# Patient Record
Sex: Male | Born: 1955 | Race: White | Hispanic: No | Marital: Single | Smoking: Never smoker
Health system: Southern US, Community
[De-identification: ages and names within clinical notes are randomized; demographics above are authoritative.]

## PROBLEM LIST (undated history)

## (undated) DIAGNOSIS — I4891 Unspecified atrial fibrillation: Secondary | ICD-10-CM

## (undated) DIAGNOSIS — F32A Depression, unspecified: Secondary | ICD-10-CM

## (undated) DIAGNOSIS — F309 Manic episode, unspecified: Secondary | ICD-10-CM

## (undated) DIAGNOSIS — F419 Anxiety disorder, unspecified: Secondary | ICD-10-CM

## (undated) DIAGNOSIS — I10 Essential (primary) hypertension: Secondary | ICD-10-CM

## (undated) DIAGNOSIS — F329 Major depressive disorder, single episode, unspecified: Secondary | ICD-10-CM

## (undated) HISTORY — PX: HERNIA REPAIR: SHX51

## (undated) HISTORY — DX: Anxiety disorder, unspecified: F41.9

## (undated) HISTORY — DX: Manic episode, unspecified: F30.9

## (undated) HISTORY — PX: TONSILLECTOMY: SUR1361

## (undated) HISTORY — DX: Depression, unspecified: F32.A

## (undated) HISTORY — DX: Major depressive disorder, single episode, unspecified: F32.9

---

## 2015-05-21 ENCOUNTER — Emergency Department: Payer: Managed Care, Other (non HMO)

## 2015-05-21 ENCOUNTER — Encounter: Payer: Self-pay | Admitting: Emergency Medicine

## 2015-05-21 ENCOUNTER — Inpatient Hospital Stay
Admission: EM | Admit: 2015-05-21 | Discharge: 2015-05-25 | DRG: 291 | Disposition: A | Discharge status: Home / Self Care | Payer: Managed Care, Other (non HMO) | Attending: Internal Medicine | Admitting: Internal Medicine

## 2015-05-21 DIAGNOSIS — J189 Pneumonia, unspecified organism: Secondary | ICD-10-CM

## 2015-05-21 DIAGNOSIS — Z79899 Other long term (current) drug therapy: Secondary | ICD-10-CM

## 2015-05-21 DIAGNOSIS — Z6833 Body mass index (BMI) 33.0-33.9, adult: Secondary | ICD-10-CM

## 2015-05-21 DIAGNOSIS — Z8249 Family history of ischemic heart disease and other diseases of the circulatory system: Secondary | ICD-10-CM

## 2015-05-21 DIAGNOSIS — E669 Obesity, unspecified: Secondary | ICD-10-CM | POA: Diagnosis present

## 2015-05-21 DIAGNOSIS — I110 Hypertensive heart disease with heart failure: Principal | ICD-10-CM | POA: Diagnosis present

## 2015-05-21 DIAGNOSIS — I482 Chronic atrial fibrillation: Secondary | ICD-10-CM | POA: Diagnosis present

## 2015-05-21 DIAGNOSIS — I509 Heart failure, unspecified: Secondary | ICD-10-CM | POA: Diagnosis not present

## 2015-05-21 DIAGNOSIS — E871 Hypo-osmolality and hyponatremia: Secondary | ICD-10-CM | POA: Diagnosis present

## 2015-05-21 DIAGNOSIS — I4891 Unspecified atrial fibrillation: Secondary | ICD-10-CM | POA: Diagnosis present

## 2015-05-21 DIAGNOSIS — J9601 Acute respiratory failure with hypoxia: Secondary | ICD-10-CM | POA: Diagnosis present

## 2015-05-21 DIAGNOSIS — Z7982 Long term (current) use of aspirin: Secondary | ICD-10-CM

## 2015-05-21 HISTORY — DX: Unspecified atrial fibrillation: I48.91

## 2015-05-21 HISTORY — DX: Essential (primary) hypertension: I10

## 2015-05-21 LAB — CBC WITH DIFFERENTIAL/PLATELET
BASOS ABS: 0 10*3/uL (ref 0–0.1)
Basophils Relative: 0 %
EOS ABS: 0 10*3/uL (ref 0–0.7)
EOS PCT: 0 %
HCT: 42.2 % (ref 40.0–52.0)
Hemoglobin: 14.3 g/dL (ref 13.0–18.0)
LYMPHS PCT: 10 %
Lymphs Abs: 0.9 10*3/uL — ABNORMAL LOW (ref 1.0–3.6)
MCH: 32.1 pg (ref 26.0–34.0)
MCHC: 33.9 g/dL (ref 32.0–36.0)
MCV: 94.8 fL (ref 80.0–100.0)
Monocytes Absolute: 1.3 10*3/uL — ABNORMAL HIGH (ref 0.2–1.0)
Monocytes Relative: 15 %
NEUTROS PCT: 75 %
Neutro Abs: 6.7 10*3/uL — ABNORMAL HIGH (ref 1.4–6.5)
PLATELETS: 155 10*3/uL (ref 150–440)
RBC: 4.45 MIL/uL (ref 4.40–5.90)
RDW: 13.9 % (ref 11.5–14.5)
WBC: 8.9 10*3/uL (ref 3.8–10.6)

## 2015-05-21 LAB — APTT: APTT: 33 s (ref 24–36)

## 2015-05-21 LAB — COMPREHENSIVE METABOLIC PANEL
ALBUMIN: 3.7 g/dL (ref 3.5–5.0)
ALK PHOS: 83 U/L (ref 38–126)
ALT: 31 U/L (ref 17–63)
ANION GAP: 9 (ref 5–15)
AST: 37 U/L (ref 15–41)
BILIRUBIN TOTAL: 1 mg/dL (ref 0.3–1.2)
BUN: 16 mg/dL (ref 6–20)
CALCIUM: 8.8 mg/dL — AB (ref 8.9–10.3)
CO2: 23 mmol/L (ref 22–32)
Chloride: 100 mmol/L — ABNORMAL LOW (ref 101–111)
Creatinine, Ser: 0.84 mg/dL (ref 0.61–1.24)
GFR calc Af Amer: >60 mL/min (ref >60)
GFR calc non Af Amer: >60 mL/min (ref >60)
GLUCOSE: 112 mg/dL — AB (ref 65–99)
Potassium: 4 mmol/L (ref 3.5–5.1)
SODIUM: 132 mmol/L — AB (ref 135–145)
TOTAL PROTEIN: 7.3 g/dL (ref 6.5–8.1)

## 2015-05-21 LAB — BRAIN NATRIURETIC PEPTIDE: B Natriuretic Peptide: 654 pg/mL — ABNORMAL HIGH (ref 0.0–100.0)

## 2015-05-21 LAB — PROTIME-INR
INR: 1.19
Prothrombin Time: 15.3 seconds — ABNORMAL HIGH (ref 11.4–15.0)

## 2015-05-21 LAB — BLOOD GAS, ARTERIAL
ALLENS TEST (PASS/FAIL): POSITIVE — AB
Acid-base deficit: 2 mmol/L (ref 0.0–2.0)
BICARBONATE: 22.3 meq/L (ref 21.0–28.0)
FIO2: 0.21
O2 Saturation: 92.7 %
PH ART: 7.4 (ref 7.350–7.450)
PO2 ART: 66 mmHg — AB (ref 83.0–108.0)
Patient temperature: 37
pCO2 arterial: 36 mmHg (ref 32.0–48.0)

## 2015-05-21 LAB — TROPONIN I
Troponin I: <0.03 ng/mL (ref <0.031)
Troponin I: 0.03 ng/mL (ref <0.031)

## 2015-05-21 LAB — GLUCOSE, CAPILLARY: Glucose-Capillary: 130 mg/dL — ABNORMAL HIGH (ref 65–99)

## 2015-05-21 MED ORDER — DILTIAZEM HCL 25 MG/5ML IV SOLN
20.0000 mg | Freq: Once | INTRAVENOUS | Status: AC
  Administered 2015-05-21: 10 mg via INTRAVENOUS
  Filled 2015-05-21: qty 5

## 2015-05-21 MED ORDER — ACETAMINOPHEN 325 MG PO TABS: 650.0000 mg | ORAL_TABLET | Freq: Four times a day (QID) | ORAL | Status: DC | PRN

## 2015-05-21 MED ORDER — ONDANSETRON HCL 4 MG/2ML IJ SOLN: 4.0000 mg | Freq: Four times a day (QID) | INTRAMUSCULAR | Status: DC | PRN

## 2015-05-21 MED ORDER — DILTIAZEM HCL ER COATED BEADS 240 MG PO CP24
240.0000 mg | ORAL_CAPSULE | Freq: Once | ORAL | Status: AC
  Administered 2015-05-21: 240 mg via ORAL
  Filled 2015-05-21: qty 1

## 2015-05-21 MED ORDER — FUROSEMIDE 10 MG/ML IJ SOLN
60.0000 mg | Freq: Once | INTRAMUSCULAR | Status: AC
  Administered 2015-05-21: 60 mg via INTRAVENOUS
  Filled 2015-05-21: qty 8

## 2015-05-21 MED ORDER — SODIUM CHLORIDE 0.9 % IV SOLN: 250.0000 mL | INTRAVENOUS | Status: DC | PRN

## 2015-05-21 MED ORDER — ZOLPIDEM TARTRATE 5 MG PO TABS: 5.0000 mg | ORAL_TABLET | Freq: Every evening | ORAL | Status: DC | PRN

## 2015-05-21 MED ORDER — CETYLPYRIDINIUM CHLORIDE 0.05 % MT LIQD: 7.0000 mL | Freq: Two times a day (BID) | OROMUCOSAL | Status: DC

## 2015-05-21 MED ORDER — DILTIAZEM HCL ER COATED BEADS 240 MG PO CP24: 240.0000 mg | ORAL_CAPSULE | Freq: Once | ORAL | Status: DC

## 2015-05-21 MED ORDER — SODIUM CHLORIDE 0.9% FLUSH
3.0000 mL | Freq: Two times a day (BID) | INTRAVENOUS | Status: DC
  Administered 2015-05-22 – 2015-05-24 (×5): 3 mL via INTRAVENOUS

## 2015-05-21 MED ORDER — SENNOSIDES-DOCUSATE SODIUM 8.6-50 MG PO TABS: 1.0000 | ORAL_TABLET | Freq: Every evening | ORAL | Status: DC | PRN

## 2015-05-21 MED ORDER — FUROSEMIDE 10 MG/ML IJ SOLN
20.0000 mg | Freq: Two times a day (BID) | INTRAMUSCULAR | Status: DC
  Administered 2015-05-21 – 2015-05-25 (×8): 20 mg via INTRAVENOUS
  Filled 2015-05-21 (×8): qty 2

## 2015-05-21 MED ORDER — SODIUM CHLORIDE 0.9% FLUSH
3.0000 mL | Freq: Two times a day (BID) | INTRAVENOUS | Status: DC
  Administered 2015-05-21 – 2015-05-25 (×5): 3 mL via INTRAVENOUS

## 2015-05-21 MED ORDER — DILTIAZEM HCL ER 240 MG PO CP24
240.0000 mg | ORAL_CAPSULE | Freq: Every day | ORAL | Status: DC
  Administered 2015-05-22: 240 mg via ORAL
  Filled 2015-05-21 (×2): qty 1

## 2015-05-21 MED ORDER — ASPIRIN EC 81 MG PO TBEC
81.0000 mg | DELAYED_RELEASE_TABLET | Freq: Every day | ORAL | Status: DC
  Administered 2015-05-22 – 2015-05-25 (×4): 81 mg via ORAL
  Filled 2015-05-21 (×5): qty 1

## 2015-05-21 MED ORDER — ENALAPRIL MALEATE 10 MG PO TABS
10.0000 mg | ORAL_TABLET | Freq: Every day | ORAL | Status: DC
Start: 2015-05-21 — End: 2015-05-25
  Administered 2015-05-22 – 2015-05-25 (×4): 10 mg via ORAL
  Filled 2015-05-21 (×2): qty 2
  Filled 2015-05-21 (×3): qty 1

## 2015-05-21 MED ORDER — ONDANSETRON HCL 4 MG PO TABS: 4.0000 mg | ORAL_TABLET | Freq: Four times a day (QID) | ORAL | Status: DC | PRN

## 2015-05-21 MED ORDER — DILTIAZEM HCL 100 MG IV SOLR
5.0000 mg/h | INTRAVENOUS | Status: DC
  Administered 2015-05-21: 5 mg/h via INTRAVENOUS
  Administered 2015-05-21: 15 mg/h via INTRAVENOUS
  Administered 2015-05-22: 5 mg/h via INTRAVENOUS
  Filled 2015-05-21 (×4): qty 100

## 2015-05-21 MED ORDER — SODIUM CHLORIDE 0.9% FLUSH: 3.0000 mL | INTRAVENOUS | Status: DC | PRN

## 2015-05-21 MED ORDER — ADULT MULTIVITAMIN W/MINERALS CH
1.0000 | ORAL_TABLET | Freq: Every day | ORAL | Status: DC
  Administered 2015-05-22 – 2015-05-25 (×4): 1 via ORAL
  Filled 2015-05-21 (×5): qty 1

## 2015-05-21 MED ORDER — ENOXAPARIN SODIUM 40 MG/0.4ML ~~LOC~~ SOLN
40.0000 mg | SUBCUTANEOUS | Status: DC
  Administered 2015-05-21 – 2015-05-24 (×4): 40 mg via SUBCUTANEOUS
  Filled 2015-05-21 (×4): qty 0.4

## 2015-05-21 MED ORDER — ACETAMINOPHEN 650 MG RE SUPP: 650.0000 mg | Freq: Four times a day (QID) | RECTAL | Status: DC | PRN

## 2015-05-21 MED ORDER — CHLORHEXIDINE GLUCONATE 0.12 % MT SOLN
15.0000 mL | Freq: Two times a day (BID) | OROMUCOSAL | Status: DC
  Administered 2015-05-22 – 2015-05-25 (×4): 15 mL via OROMUCOSAL
  Filled 2015-05-21 (×4): qty 15

## 2015-05-21 NOTE — H&P (Signed)
[facility] - [place] at [facility]   PATIENT NAME: Gianmarco Roye    MR#:  [id number]  DATE OF BIRTH:  Jul 07, 1955  DATE OF ADMISSION:  05/21/2015  PRIMARY CARE PHYSICIAN: [facility]   REQUESTING/REFERRING PHYSICIAN: McShane  CHIEF COMPLAINT:  Shortness of breath  HISTORY OF PRESENT ILLNESS:  Montay Vanvoorhis  is a 60 y.o. male with a known history of Essential hypertension and chronic atrial fibrillation, not on any anticoagulation other than aspirin is presenting to the ED with a chief complaint of shortness of breath. Patient has been short of breath for 1 day and reporting lower extremity edema for the past 2-3 days. He also has upper respiratory tract infection with nasal stuffiness and congestion for approximately 10 days and treated with antibiotics. Today patient is a very short of breath and went to the urgent care where his heart rate was found to be very fast and he was found in A. fib with RVR. Denies any chest pain, as patient's heart rate was at around 130s urgent care people have called EMS, patient was given Cardizem IV 2 boluses in the ED but patient's heart rate was still at 110-120. Patient was also given his home medication Cardizem CD by the ED physician. During my examination patient's heart rate was at around 120s to 130s. Cardizem drip was initiated for better control. Chest x-ray has revealed pulmonary edema  PAST MEDICAL HISTORY:   Past Medical History  Diagnosis Date  . Hypertension   . A-fib (HCC)     PAST SURGICAL HISTOIRY:   Past Surgical History  Procedure Laterality Date  . Hernia repair    . Tonsillectomy      SOCIAL HISTORY:   Social History  Substance Use Topics  . Smoking status: Never Smoker   . Smokeless tobacco: Not on file  . Alcohol Use: No    FAMILY HISTORY:  Essential hypertension  DRUG ALLERGIES:  No Known Allergies  REVIEW OF SYSTEMS:  CONSTITUTIONAL: No fever,Reporting generalized  weakness  EYES: No blurred or double vision.  EARS, NOSE, AND THROAT: No tinnitus or ear pain.  RESPIRATORY: Intermittent episodes of productive cough, shortness of breath, denies wheezing or hemoptysis.  CARDIOVASCULAR: No chest pain, orthopnea, reporting lower extremity edema.  GASTROINTESTINAL: No nausea, vomiting, diarrhea or abdominal pain.  GENITOURINARY: No dysuria, hematuria.  ENDOCRINE: No polyuria, nocturia,  HEMATOLOGY: No anemia, easy bruising or bleeding SKIN: No rash or lesion. MUSCULOSKELETAL: No joint pain or arthritis.   NEUROLOGIC: No tingling, numbness, weakness.  PSYCHIATRY: No anxiety or depression.   MEDICATIONS AT HOME:   Prior to Admission medications   Medication Sig Start Date End Date Taking? Authorizing Provider  aspirin EC 81 MG tablet Take 81 mg by mouth daily.   Yes Historical Provider, MD  diltiazem (DILACOR XR) 240 MG 24 hr capsule Take 240 mg by mouth daily.   Yes Historical Provider, MD  enalapril (VASOTEC) 10 MG tablet Take 10 mg by mouth daily.   Yes Historical Provider, MD  Multiple Vitamins-Minerals (MULTIVITAMIN WITH MINERALS) tablet Take 1 tablet by mouth daily.   Yes Historical Provider, MD      VITAL SIGNS:  Blood pressure 112/94, pulse 56, resp. rate 26, height  (1.854 m), weight 108.863 kg (240 lb), SpO2 97 %.  PHYSICAL EXAMINATION:  GENERAL:  59 y.o.-year-old patient lying in the bed with no acute distress.  EYES: Pupils equal, round, reactive to light and accommodation. No scleral icterus. Extraocular muscles  intact.  HEENT: Head atraumatic, normocephalic. Oropharynx and nasopharynx Congested NECK:  Supple, no jugular venous distention. No thyroid enlargement, no tenderness.  LUNGS: Moderate breath sounds bilaterally, very minimal wheezing, positive rales,rhonchi , no crepitation. No use of accessory muscles of respiration.  CARDIOVASCULAR: Irregularly irregular. No murmurs, rubs, or gallops.  ABDOMEN: Soft, nontender,  nondistended. Bowel sounds present. No organomegaly or mass.  EXTREMITIES: 1+ pitting pedal edema, no cyanosis, or clubbing.  NEUROLOGIC: Cranial nerves II through XII are intact. Muscle strength 5/5 in all extremities. Sensation intact. Gait not checked.  PSYCHIATRIC: The patient is alert and oriented x 3.  SKIN: No obvious rash, lesion, or ulcer.   LABORATORY PANEL:   CBC  Recent Labs Lab 05/21/15 0951  WBC 8.9  HGB 14.3  HCT 42.2  PLT 155   ------------------------------------------------------------------------------------------------------------------  Chemistries   Recent Labs Lab 05/21/15 0951  NA 132*  K 4.0  CL 100*  CO2 23  GLUCOSE 112*  BUN 16  CREATININE 0.84  CALCIUM 8.8*  AST 37  ALT 31  ALKPHOS 83  BILITOT 1.0   ------------------------------------------------------------------------------------------------------------------  Cardiac Enzymes  Recent Labs Lab 05/21/15 0951  TROPONINI <0.03   ------------------------------------------------------------------------------------------------------------------  RADIOLOGY:  Dg Chest 2 View  05/21/2015  CLINICAL DATA:  Shortness of breath. EXAM: CHEST  2 VIEW COMPARISON:  None. FINDINGS: Cardiomediastinal silhouette is within normal limits. No pneumothorax is noted. Bilateral perihilar and basilar interstitial densities are noted concerning for pulmonary edema or possibly pneumonia. Minimal bilateral pleural effusions are noted. Bony thorax is unremarkable. IMPRESSION: Bilateral perihilar and basilar interstitial densities concerning for pulmonary edema or less likely pneumonia. Minimal bilateral pleural effusions are noted. Electronically Signed   By: Lupita Raider, M.D.   On: 05/21/2015 10:37    EKG:   Orders placed or performed during the hospital encounter of 05/21/15  . EKG 12-Lead  . EKG 12-Lead    IMPRESSION AND PLAN:  Amiere Cawley  is a 60 y.o. male with a known history of Essential  hypertension and chronic atrial fibrillation, not on any anticoagulation other than aspirin is presenting to the ED with a chief complaint of shortness of breath. Patient has been short of breath for 1 day and reporting lower extremity edema for the past 2-3 days. He also has upper respiratory tract infection with nasal stuffiness and congestion for approximately 10 days and treated with antibiotics. Today patient is a very short of breath and went to the urgent care where his heart rate was found to be very fast and he was found in A. fib with RVR. Chest x-ray has revealed pulmonary edema. IV Lasix was given in the ED  #1 acute respiratory distress secondary to new onset congestive heart failure and A. fib with RVR Will provide oxygen via nasal cannula Lasix 60 mg IV was given in the ED Start the patient on BiPAP 2 Cardizem boluses were given with no improvement will start him on Cardizem drip  #2 acute congestive heart failure with pulmonary edema and lower extremity pitting edema Telemetry monitoring. Echo cardiac rhythm is ordered to evaluate ejection fraction and valvular dysfunction Continue Lasix IV. Daily weight monitoring and intake and output Continue baby aspirin. Cycle troponins. Cardiac consult is placed to Dr. Gwen Pounds BiPAP to help with diuresis  #3 atrial fibrillation with RVR Cardizem drip to titrate his heart rate 100. Resume his home medication Cardizem CD, today's dose was given in the ED Check TSH Patient is not on any anticoagulation other than baby  aspirin and [facility], will follow up with them regarding oral anticoagulation consideration, not sure whether he has any contraindications   #4 essential hypertension resume his home medications. Continue Vasotec and diltiazem  #5 obesity. Lifestyle medications are advised    All the records are reviewed and case discussed with ED provider. Management plans discussed with the patient, family and they are in  agreement.  CODE STATUS: Full code, 2 sons at the healthcare power of attorney  TOTAL CRITICAL CARE TAKING CARE OF THIS PATIENT: 45 minutes.    Ramonita LabGouru, Davie Sagona M.D on 05/21/2015 at 12:47 PM  Between 7am to 6pm - Pager - [phone]  After 6pm go to [email or web address] - password [email or web address] [facility]  [facility] [facility]  Office  [phone]  CC: Primary care physician; [facility]

## 2015-05-21 NOTE — Progress Notes (Signed)
Pt currently on Bipap. States "feels better". No complaints of pain. Report given to on coming RN.

## 2015-05-21 NOTE — ED Notes (Signed)
Pt O2 sats decreased to 93% with labored breathing; O2 [place] placed at 2L

## 2015-05-21 NOTE — ED Notes (Signed)
Ems reports that when they arrived, pt on 6L O2 and was satting in upper 90's. Pt given duoneb and not placed back on O2. Sat 94% upon assessment.

## 2015-05-21 NOTE — ED Notes (Signed)
Pt via ems from [facility] with increasing sob x 2 days. He was seen there for URI a week or so ago and returned today. Upon EMS arrival, he was in A fib with 150-170 HR. He was given duoneb + albuterol + 25mg  of cardizem en route by ems. Pt alert & oriented and states he has not taken his HTN or afib mnedication today

## 2015-05-21 NOTE — ED Provider Notes (Signed)
[facility] Emergency Department Provider Note  ____________________________________________   I have reviewed the triage vital signs and the nursing notes.   HISTORY  Chief Complaint Shortness of Breath    HPI Timothy Roach is a 60 y.o. male who presents today complaining of shortness of breath. Patient states he has a history of atrial fibrillation. He does not know the has a history of heart failure. Patient is a somewhat poor historian. He was treated for URI symptoms 2 weeks ago with antibiotics but then over the last 3-4 days he states he became acutely more short of breath. He has had some mild orthopnea. Patient states he did not take his diltiazem today nor did he take his hypertensive medications. He went to the minor care to get antibiotics for what he felt was another URI, given that he had some degree of cough but mostly shortness of breath. Upon their evaluation the patient was in A. fib with RVR and he was sent here for further evaluation. It was noted at the urgent care that the patient has bilateral lower extremity edema, which the patient was not aware of. The patient himself states that he has not noticed anything like that. He denies any fevers. He does state that he has a occasionally productive cough. He denies any other infectious symptoms. He states that he does have occasional orthopnea. He does not have any chest pain. EMS did note his A. fib with RVR and gave her appropriate medications and his heart rates in the 110 to 1:30 range at this time.      Past Medical History  Diagnosis Date  . Hypertension   . A-fib (HCC)     There are no active problems to display for this patient.   Past Surgical History  Procedure Laterality Date  . Hernia repair    . Tonsillectomy      No current outpatient prescriptions on file.  Allergies Review of patient's allergies indicates no known allergies.  History reviewed. No pertinent family  history.  Social History Social History  Substance Use Topics  . Smoking status: Never Smoker   . Smokeless tobacco: None  . Alcohol Use: No    Review of Systems Constitutional: No fever/chills Eyes: No visual changes. ENT: No sore throat. No stiff neck no neck pain Cardiovascular: Denies chest pain. Respiratory: Positive shortness of breath. Gastrointestinal:   no vomiting.  No diarrhea.  No constipation. Genitourinary: Negative for dysuria. Musculoskeletal: Negative lower extremity swelling Skin: Negative for rash. Neurological: Negative for headaches, focal weakness or numbness. 10-point ROS otherwise negative.  ____________________________________________   PHYSICAL EXAM:  VITAL SIGNS: ED Triage Vitals  Enc Vitals Group     BP --      Pulse --      Resp --      Temp --      Temp src --      SpO2 --      Weight 05/21/15 0954 240 lb (108.863 kg)     Height 05/21/15 0954 6\' 1"  (1.854 m)     Head Cir --      Peak Flow --      Pain Score --      Pain Loc --      Pain Edu? --      Excl. in GC? --     Constitutional: Alert and oriented. Well appearing and in no acute distress. Eyes: Conjunctivae are normal. PERRL. EOMI. Head: Atraumatic. Nose: No congestion/rhinnorhea. Mouth/Throat: Mucous  membranes are moist.  Oropharynx non-erythematous. Neck: No stridor.   Nontender with no meningismus Cardiovascular: Normal rate, regular rhythm. Grossly normal heart sounds.  Good peripheral circulation. Respiratory: Increased work of breathing, mild to moderate  No retractions. Prolonged expiratory phase with mild wheeze and bibasilar rails noted Abdominal: Soft and nontender. No distention. No guarding no rebound Back:  There is no focal tenderness or step off there is no midline tenderness there are no lesions noted. there is no CVA tenderness Musculoskeletal: No lower extremity tenderness. No joint effusions, no DVT signs strong distal pulses 2+ bilateral symmetric pitting  edema Neurologic:  Normal speech and language. No gross focal neurologic deficits are appreciated.  Skin:  Skin is warm, dry and intact. No rash noted. Psychiatric: Mood and affect are normal. Speech and behavior are normal.  ____________________________________________   LABS (all labs ordered are listed, but only abnormal results are displayed)  Labs Reviewed  CULTURE, BLOOD (ROUTINE X 2)  CULTURE, BLOOD (ROUTINE X 2)  COMPREHENSIVE METABOLIC PANEL  TROPONIN I  BRAIN NATRIURETIC PEPTIDE  CBC WITH DIFFERENTIAL/PLATELET  BLOOD GAS, ARTERIAL   ____________________________________________  EKG  I personally interpreted any EKGs ordered by me or triage Atrial fibrillation rate 107 no acute ST elevation no acute ST depression nonspecific ST changes normal axis. ____________________________________________  RADIOLOGY  I reviewed any imaging ordered by me or triage that were performed during my shift and, if possible, patient and/or family made aware of any abnormal findings. ____________________________________________   PROCEDURES  Procedure(s) performed: None  Critical Care performed: None  ____________________________________________   INITIAL IMPRESSION / ASSESSMENT AND PLAN / ED COURSE  Pertinent labs & imaging results that were available during my care of the patient were reviewed by me and considered in my medical decision making (see chart for details).  Patient with some of the stigmata of CHF including bibasilar edema. He's had increasing shortness of breath over the last few days. Not a very convincing story for pneumonia but certainly possible we'll obtain blood cultures and a chest x-ray. Patient was in A. fib with RVR but he does have a history of A. fib, and he received medication has brought his rate down since his arrival. We will start him on his home diltiazem to see if I can keep a better rate control by evaluation is short of breath. I do not think at  drip is indicated at this time. Pressures holding steady. Certainly possible patient had decompensated atrial fibrillation for the last several days with concomitant CHF as a result. However, occult infarction or other pathology of cardiogenic origin certainly could be possible as well. We'll send troponin, and reassess. ____________________________________________   FINAL CLINICAL IMPRESSION(S) / ED DIAGNOSES  Final diagnoses:  None      This chart was dictated using voice recognition software.  Despite best efforts to proofread,  errors can occur which can change meaning.     Jeanmarie Plant, MD 05/21/15 1017

## 2015-05-22 ENCOUNTER — Inpatient Hospital Stay
Admit: 2015-05-22 | Discharge: 2015-05-22 | Disposition: A | Discharge status: Home / Self Care | Payer: Managed Care, Other (non HMO) | Attending: Internal Medicine | Admitting: Internal Medicine

## 2015-05-22 LAB — COMPREHENSIVE METABOLIC PANEL
ALK PHOS: 77 U/L (ref 38–126)
ALT: 27 U/L (ref 17–63)
ANION GAP: 10 (ref 5–15)
AST: 29 U/L (ref 15–41)
Albumin: 3.4 g/dL — ABNORMAL LOW (ref 3.5–5.0)
BUN: 20 mg/dL (ref 6–20)
CALCIUM: 8.4 mg/dL — AB (ref 8.9–10.3)
CO2: 25 mmol/L (ref 22–32)
CREATININE: 1.04 mg/dL (ref 0.61–1.24)
Chloride: 98 mmol/L — ABNORMAL LOW (ref 101–111)
Glucose, Bld: 103 mg/dL — ABNORMAL HIGH (ref 65–99)
Potassium: 4.2 mmol/L (ref 3.5–5.1)
SODIUM: 133 mmol/L — AB (ref 135–145)
Total Bilirubin: 1.5 mg/dL — ABNORMAL HIGH (ref 0.3–1.2)
Total Protein: 6.8 g/dL (ref 6.5–8.1)

## 2015-05-22 LAB — TROPONIN I

## 2015-05-22 LAB — CBC
HCT: 39.2 % — ABNORMAL LOW (ref 40.0–52.0)
HEMOGLOBIN: 13.6 g/dL (ref 13.0–18.0)
MCH: 33 pg (ref 26.0–34.0)
MCHC: 34.8 g/dL (ref 32.0–36.0)
MCV: 94.8 fL (ref 80.0–100.0)
PLATELETS: 131 10*3/uL — AB (ref 150–440)
RBC: 4.14 MIL/uL — AB (ref 4.40–5.90)
RDW: 13.9 % (ref 11.5–14.5)
WBC: 8.2 10*3/uL (ref 3.8–10.6)

## 2015-05-22 LAB — TSH: TSH: 1.043 u[IU]/mL (ref 0.350–4.500)

## 2015-05-22 MED ORDER — LEVOFLOXACIN IN D5W 500 MG/100ML IV SOLN
500.0000 mg | INTRAVENOUS | Status: DC
  Administered 2015-05-22: 500 mg via INTRAVENOUS
  Filled 2015-05-22 (×3): qty 100

## 2015-05-22 MED ORDER — DILTIAZEM HCL 60 MG PO TABS
60.0000 mg | ORAL_TABLET | Freq: Four times a day (QID) | ORAL | Status: DC
  Administered 2015-05-22 – 2015-05-23 (×4): 60 mg via ORAL
  Filled 2015-05-22 (×4): qty 1

## 2015-05-22 NOTE — Progress Notes (Signed)
[facility] - [place] at [facility]   PATIENT NAME: Timothy Roach    MRN#:  [id number]  DATE OF BIRTH:  06/21/1955  SUBJECTIVE:  Hospital Day: 1 day Timothy Roach is a 60 y.o. male presenting with Shortness of Breath .   Overnight events: Remains on Cardizem drip, taken off BiPAP Interval Events: Shortness of breath improved no further complaints  REVIEW OF SYSTEMS:  CONSTITUTIONAL: No fever, fatigue or weakness.  EYES: No blurred or double vision.  EARS, NOSE, AND THROAT: No tinnitus or ear pain.  RESPIRATORY: Positive cough, shortness of breath, denies wheezing or hemoptysis.  CARDIOVASCULAR: No chest pain, orthopnea, positive edema.  GASTROINTESTINAL: No nausea, vomiting, diarrhea or abdominal pain.  GENITOURINARY: No dysuria, hematuria.  ENDOCRINE: No polyuria, nocturia,  HEMATOLOGY: No anemia, easy bruising or bleeding SKIN: No rash or lesion. MUSCULOSKELETAL: No joint pain or arthritis.   NEUROLOGIC: No tingling, numbness, weakness.  PSYCHIATRY: No anxiety or depression.   DRUG ALLERGIES:  No Known Allergies  VITALS:  Blood pressure 100/85, pulse 37, temperature 98.6 F (37 C), temperature source Axillary, resp. rate 20, height  (1.854 m), weight 115.2 kg (253 lb 15.5 oz), SpO2 95 %.  PHYSICAL EXAMINATION:  VITAL SIGNS: Filed Vitals:   05/22/15 1200 05/22/15 1300  BP: 105/81 100/85  Pulse: 111 37  Temp: 98.6 F (37 C)   Resp: 23 20   GENERAL:59 y.o.male currently in no acute distress.  HEAD: Normocephalic, atraumatic.  EYES: Pupils equal, round, reactive to light. Extraocular muscles intact. No scleral icterus.  MOUTH: Moist mucosal membrane. Dentition intact. No abscess noted.  EAR, NOSE, THROAT: Clear without exudates. No external lesions.  NECK: Supple. No thyromegaly. No nodules. No JVD.  PULMONARY: Scant right lower rhonchi diminished breath sounds throughout, without wheeze r No use of accessory muscles, Good  respiratory effort. good air entry bilaterally CHEST: Nontender to palpation.  CARDIOVASCULAR: S1 and S2. Irregular rate and rhythm No murmurs, rubs, or gallops. 1+ edema. Pedal pulses 2+ bilaterally.  GASTROINTESTINAL: Soft, nontender, nondistended. No masses. Positive bowel sounds. No hepatosplenomegaly.  MUSCULOSKELETAL: No swelling, clubbing, or edema. Range of motion full in all extremities.  NEUROLOGIC: Cranial nerves II through XII are intact. No gross focal neurological deficits. Sensation intact. Reflexes intact.  SKIN: No ulceration, lesions, rashes, or cyanosis. Skin warm and dry. Turgor intact.  PSYCHIATRIC: Mood, affect within normal limits. The patient is awake, alert and oriented x 3. Insight, judgment intact.      LABORATORY PANEL:   CBC  Recent Labs Lab 05/22/15 0556  WBC 8.2  HGB 13.6  HCT 39.2*  PLT 131*   ------------------------------------------------------------------------------------------------------------------  Chemistries   Recent Labs Lab 05/22/15 0556  NA 133*  K 4.2  CL 98*  CO2 25  GLUCOSE 103*  BUN 20  CREATININE 1.04  CALCIUM 8.4*  AST 29  ALT 27  ALKPHOS 77  BILITOT 1.5*   ------------------------------------------------------------------------------------------------------------------  Cardiac Enzymes  Recent Labs Lab 05/22/15 0556  TROPONINI <0.03   ------------------------------------------------------------------------------------------------------------------  RADIOLOGY:  Dg Chest 2 View  05/21/2015  CLINICAL DATA:  Shortness of breath. EXAM: CHEST  2 VIEW COMPARISON:  None. FINDINGS: Cardiomediastinal silhouette is within normal limits. No pneumothorax is noted. Bilateral perihilar and basilar interstitial densities are noted concerning for pulmonary edema or possibly pneumonia. Minimal bilateral pleural effusions are noted. Bony thorax is unremarkable. IMPRESSION: Bilateral perihilar and basilar interstitial densities  concerning for pulmonary edema or less likely pneumonia. Minimal bilateral pleural effusions are noted. Electronically  Signed   By: Lupita RaiderJames  Green Jr, M.D.   On: 05/21/2015 10:37    EKG:   Orders placed or performed during the hospital encounter of 05/21/15  . EKG 12-Lead  . EKG 12-Lead    ASSESSMENT AND PLAN:   Timothy Roach is a 60 y.o. male presenting with Shortness of Breath . Admitted 05/21/2015 : Day #: 1 day 1. Atrial fibrillation rapid ventricular response: Cardiology input appreciated remains on Cardizem drips started on oral Cardizem with goal of being titrated off drip today 2. Acute congestive heart failure unspecified: Check echocardiogram continue with diuresis 3. Community acquired pneumonia: Patient had signs/symptoms cough subjective fever/chills however without leukocytosis seems somewhat unlikely we'll provide antibiotic coverage for now and recheck chest x-ray tomorrow after diuresis to see if any improvement 4. Essential hypertension on enalapril 5. Venous thromboembolism prophylactic: Lovenox, can discontinue SCDs Disposition transfer out of intensive care to telemetry   All the records are reviewed and case discussed with Care Management/Social Workerr. Management plans discussed with the patient, family and they are in agreement.  CODE STATUS: full TOTAL TIME TAKING CARE OF THIS PATIENT: 28 minutes.   POSSIBLE D/C IN 2-3DAYS, DEPENDING ON CLINICAL CONDITION.   Hower,  Mardi MainlandDavid K M.D on 05/22/2015 at 3:38 PM  Between 7am to 6pm - Pager - [phone]  After 6pm: House Pager: - [phone]  [facility]  Office  [phone]  CC: Primary care physician; [facility]

## 2015-05-22 NOTE — Care Management (Signed)
Presents from home with shortness of breath.  Had nasal congestion and swelling of his lower extremities. Found to be in atrial fib with rvr.  Patient does have chronic a fib. On cardizem drip.   Was on aspirin.  "I have never been on any blood thinners."  Independent in adls, son lives with him, no issues accessing medical care.  Current with his PCP.  Denies issues obtaining medications.  No DME

## 2015-05-22 NOTE — Progress Notes (Signed)
Patient transferred from ICU to room 242, no acute distress noted or any c/o pain. Tele box verified by the RN and Trey PaulaJeff NT. Skin assessment done with Antonieta IbaMarcel Turner RN, no skin issues of concern noted but dry feet. Cardizem drip at 5 mL/hour. Will continue to monitor.

## 2015-05-22 NOTE — Progress Notes (Signed)
Complaints of dysuria, check UA

## 2015-05-22 NOTE — Progress Notes (Signed)
*  PRELIMINARY RESULTS* Echocardiogram 2D Echocardiogram has been performed.  Timothy Roach 05/22/2015, 9:12 PM

## 2015-05-22 NOTE — Consult Note (Signed)
[facility] Cardiology  CARDIOLOGY CONSULT NOTE  Patient ID: Timothy Roach MRN: [id number] DOB/AGE: [birth year] 60 y.o.  Admit date: 05/21/2015 Referring Physician [facility] Primary Physician [facility] Primary Cardiologist Gwen Pounds Reason for Consultation Atrial fibrillation  HPI: 60 year old gentleman referred for evaluation of atrial fibrillation and congestive heart failure. The patient has known history of atrial fibrillation, followed by Dr. Gwen Pounds. The patient reports a recent history of a respiratory infection, treated with outpatient anabiotics, with persistent so breath, occasional wheezing, and lower extremity edema. The patient went to the urgent care, where he was noted to be tachycardic, sent to [facility] emergency room, where EKG revealed atrial fibrillation with ventricular rate of 100 and tender 130 bpm. Patient was admitted to the ICU, treated with diltiazem drip. The patient has ruled out for myocardial infarction by negative troponin. She denies chest pain.   Review of systems complete and found to be negative unless listed above     Past Medical History  Diagnosis Date  . Hypertension   . A-fib [facility])     Past Surgical History  Procedure Laterality Date  . Hernia repair    . Tonsillectomy      Prescriptions prior to admission  Medication Sig Dispense Refill Last Dose  . aspirin EC 81 MG tablet Take 81 mg by mouth daily.   05/20/2015 at am  . diltiazem (DILACOR XR) 240 MG 24 hr capsule Take 240 mg by mouth daily.   05/20/2015 at am  . enalapril (VASOTEC) 10 MG tablet Take 10 mg by mouth daily.   05/20/2015 at am  . Multiple Vitamins-Minerals (MULTIVITAMIN WITH MINERALS) tablet Take 1 tablet by mouth daily.   05/20/2015 at am   Social History   Social History  . Marital Status: Single    Spouse Name: N/A  . Number of Children: N/A  . Years of Education: N/A   Occupational History  . Not on file.   Social History Main Topics  . Smoking status: Never Smoker   . Smokeless  tobacco: Not on file  . Alcohol Use: No  . Drug Use: Not on file  . Sexual Activity: Not on file   Other Topics Concern  . Not on file   Social History Narrative  . No narrative on file    History reviewed. No pertinent family history.    Review of systems complete and found to be negative unless listed above      PHYSICAL EXAM  General: Well developed, well nourished, in no acute distress HEENT:  Normocephalic and atramatic Neck:  No JVD.  Lungs: Clear bilaterally to auscultation and percussion. Heart: HRRR . Normal S1 and S2 without gallops or murmurs.  Abdomen: Bowel sounds are positive, abdomen soft and non-tender  Msk:  Back normal, normal gait. Normal strength and tone for age. Extremities: No clubbing, cyanosis or edema.   Neuro: Alert and oriented X 3. Psych:  Good affect, responds appropriately  Labs:   Lab Results  Component Value Date   WBC 8.2 05/22/2015   HGB 13.6 05/22/2015   HCT 39.2* 05/22/2015   MCV 94.8 05/22/2015   PLT 131* 05/22/2015    Recent Labs Lab 05/22/15 0556  NA 133*  K 4.2  CL 98*  CO2 25  BUN 20  CREATININE 1.04  CALCIUM 8.4*  PROT 6.8  BILITOT 1.5*  ALKPHOS 77  ALT 27  AST 29  GLUCOSE 103*   Lab Results  Component Value Date   TROPONINI <0.03 05/22/2015   No results found  for: CHOL No results found for: HDL No results found for: LDLCALC No results found for: TRIG No results found for: CHOLHDL No results found for: LDLDIRECT    Radiology: Dg Chest 2 View  05/21/2015  CLINICAL DATA:  Shortness of breath. EXAM: CHEST  2 VIEW COMPARISON:  None. FINDINGS: Cardiomediastinal silhouette is within normal limits. No pneumothorax is noted. Bilateral perihilar and basilar interstitial densities are noted concerning for pulmonary edema or possibly pneumonia. Minimal bilateral pleural effusions are noted. Bony thorax is unremarkable. IMPRESSION: Bilateral perihilar and basilar interstitial densities concerning for pulmonary edema  or less likely pneumonia. Minimal bilateral pleural effusions are noted. Electronically Signed   By: Lupita RaiderJames  Green Jr, M.D.   On: 05/21/2015 10:37    EKG: Atrial fibrillation  ASSESSMENT AND PLAN:   1. Atrial fibrillation, rate mildly elevated currently on diltiazem drip. Chads Vasc is 1, currently on aspirin for stroke prevention. 2. Respiratory failure, multifactorial, recent URI, possible diastolic congestive heart failure exacerbated by atrial fibrillation  Recommendations  1. Agree with overall current therapy 2. Continue diltiazem drip, begin taper 1 started on oral diltiazem 3. Start diltiazem 60 mg by mouth every 6 4. Continue diuresis 5. Review 2-D echocardiogram 6. Likely transfer to telemetry later today  Signed: Marieann Zipp MD,PhD, [facility] 05/22/2015, 1:31 PM

## 2015-05-23 ENCOUNTER — Inpatient Hospital Stay: Payer: Managed Care, Other (non HMO)

## 2015-05-23 LAB — URINALYSIS COMPLETE WITH MICROSCOPIC (ARMC ONLY)
BACTERIA UA: NONE SEEN
Bilirubin Urine: NEGATIVE
Glucose, UA: NEGATIVE mg/dL
KETONES UR: NEGATIVE mg/dL
Leukocytes, UA: NEGATIVE
Nitrite: NEGATIVE
PH: 7 (ref 5.0–8.0)
PROTEIN: NEGATIVE mg/dL
SQUAMOUS EPITHELIAL / LPF: NONE SEEN
Specific Gravity, Urine: 1.012 (ref 1.005–1.030)

## 2015-05-23 LAB — ECHOCARDIOGRAM COMPLETE
HEIGHTINCHES: 73 in
WEIGHTICAEL: 4063.52 [oz_av]

## 2015-05-23 LAB — MAGNESIUM: MAGNESIUM: 2 mg/dL (ref 1.7–2.4)

## 2015-05-23 MED ORDER — IPRATROPIUM-ALBUTEROL 0.5-2.5 (3) MG/3ML IN SOLN
3.0000 mL | RESPIRATORY_TRACT | Status: DC
  Administered 2015-05-23 – 2015-05-24 (×6): 3 mL via RESPIRATORY_TRACT
  Filled 2015-05-23 (×6): qty 3

## 2015-05-23 MED ORDER — IPRATROPIUM-ALBUTEROL 0.5-2.5 (3) MG/3ML IN SOLN
3.0000 mL | Freq: Once | RESPIRATORY_TRACT | Status: AC
  Administered 2015-05-23: 3 mL via RESPIRATORY_TRACT
  Filled 2015-05-23: qty 3

## 2015-05-23 MED ORDER — DILTIAZEM HCL ER COATED BEADS 240 MG PO CP24
240.0000 mg | ORAL_CAPSULE | Freq: Every day | ORAL | Status: DC
  Administered 2015-05-23 – 2015-05-25 (×3): 240 mg via ORAL
  Filled 2015-05-23 (×3): qty 1

## 2015-05-23 MED ORDER — LEVOFLOXACIN IN D5W 750 MG/150ML IV SOLN
750.0000 mg | INTRAVENOUS | Status: DC
  Administered 2015-05-23: 750 mg via INTRAVENOUS
  Filled 2015-05-23 (×2): qty 150

## 2015-05-23 MED ORDER — METOPROLOL TARTRATE 25 MG PO TABS
25.0000 mg | ORAL_TABLET | Freq: Two times a day (BID) | ORAL | Status: DC
  Administered 2015-05-23 – 2015-05-24 (×3): 25 mg via ORAL
  Filled 2015-05-23 (×3): qty 1

## 2015-05-23 NOTE — Progress Notes (Signed)
[facility]  SUBJECTIVE: I feel better   Filed Vitals:   05/23/15 0117 05/23/15 0500 05/23/15 0621 05/23/15 0841  BP:   120/69 119/82  Pulse:   89 71  Temp:   97.5 F (36.4 C)   TempSrc:   Oral   Resp:   18   Height:      Weight:  114.443 kg (252 lb 4.8 oz)    SpO2: 97%  100%      Intake/Output Summary (Last 24 hours) at 05/23/15 [id number] Last data filed at 05/23/15 0825  Gross per 24 hour  Intake     55 ml  Output   2250 ml  Net  -2195 ml      PHYSICAL EXAM  General: Well developed, well nourished, in no acute distress HEENT:  Normocephalic and atramatic Neck:  No JVD.  Lungs: Clear bilaterally to auscultation and percussion. Heart: Irregular irregular rhythm. Normal S1 and S2 without gallops or murmurs.  Abdomen: Bowel sounds are positive, abdomen soft and non-tender  Msk:  Back normal, normal gait. Normal strength and tone for age. Extremities: No clubbing, cyanosis or edema.   Neuro: Alert and oriented X 3. Psych:  Good affect, responds appropriately   LABS: Basic Metabolic Panel:  Recent Labs  25/36/6403/30/17 0951 05/22/15 0556  NA 132* 133*  K 4.0 4.2  CL 100* 98*  CO2 23 25  GLUCOSE 112* 103*  BUN 16 20  CREATININE 0.84 1.04  CALCIUM 8.8* 8.4*  MG  --  2.0   Liver Function Tests:  Recent Labs  05/21/15 0951 05/22/15 0556  AST 37 29  ALT 31 27  ALKPHOS 83 77  BILITOT 1.0 1.5*  PROT 7.3 6.8  ALBUMIN 3.7 3.4*   No results for input(s): LIPASE, AMYLASE in the last 72 hours. CBC:  Recent Labs  05/21/15 0951 05/22/15 0556  WBC 8.9 8.2  NEUTROABS 6.7*  --   HGB 14.3 13.6  HCT 42.2 39.2*  MCV 94.8 94.8  PLT 155 131*   Cardiac Enzymes:  Recent Labs  05/21/15 1738 05/21/15 2319 05/22/15 0556  TROPONINI 0.03 <0.03 <0.03   BNP: Invalid input(s): POCBNP D-Dimer: No results for input(s): DDIMER in the last 72 hours. Hemoglobin A1C: No results for input(s): HGBA1C in the last 72 hours. Fasting Lipid Panel: No results for input(s):  CHOL, HDL, LDLCALC, TRIG, CHOLHDL, LDLDIRECT in the last 72 hours. Thyroid Function Tests:  Recent Labs  05/22/15 0556  TSH 1.043   Anemia Panel: No results for input(s): VITAMINB12, FOLATE, FERRITIN, TIBC, IRON, RETICCTPCT in the last 72 hours.  Dg Chest 2 View  05/21/2015  CLINICAL DATA:  Shortness of breath. EXAM: CHEST  2 VIEW COMPARISON:  None. FINDINGS: Cardiomediastinal silhouette is within normal limits. No pneumothorax is noted. Bilateral perihilar and basilar interstitial densities are noted concerning for pulmonary edema or possibly pneumonia. Minimal bilateral pleural effusions are noted. Bony thorax is unremarkable. IMPRESSION: Bilateral perihilar and basilar interstitial densities concerning for pulmonary edema or less likely pneumonia. Minimal bilateral pleural effusions are noted. Electronically Signed   By: Lupita RaiderJames  Green Jr, M.D.   On: 05/21/2015 10:37     Echo pending   TELEMETRY: Atrial fibrillation:  ASSESSMENT AND PLAN:  Active Problems:   Atrial fibrillation with RVR (HCC)    1. Atrial fibrillation with rapid ventricular rate, rate improved but still mildly elevated, chads vasc  1 2. Respiratory failure, shortness of breath, multifactorial, secondary to recent URI, possible bronchitis/continue acquired pneumonia, with element of  acute on chronic diastolic congestive heart failure triggered by atrial fibrillation with a rapid ventricular rate  Recommendations  1. DC diltiazem 60 mg every 6 2. Start Cardizem CD 240 mg daily 3. Add metoprolol titrate 25 mg twice a day 4. Continue diuresis 5. Review 2-D echocardiogram 6. Continue aspirin for stroke prevention    Timothy Bottger, MD, PhD, [facility] 05/23/2015 9:17 AM

## 2015-05-23 NOTE — Plan of Care (Signed)
Problem: Activity: Goal: Risk for activity intolerance will decrease Outcome: Adequate for Discharge Patient can ambulate to the restroom independently. Denied any SOB or dyspnea with exertion.

## 2015-05-23 NOTE — Consult Note (Signed)
Pharmacy Antibiotic Note  Timothy Roach is a 60 y.o. male admitted on 05/21/2015 with pneumonia.  Pharmacy has been consulted for levaquin dosing.  Plan: Will initiate levofloxacin 750mg  IV q24hrs, as patient has good renal function  Height: 6\' 1"  (185.4 cm) Weight: 252 lb 4.8 oz (114.443 kg) IBW/kg (Calculated) : 79.9  Temp (24hrs), Avg:97.7 F (36.5 C), Min:97.5 F (36.4 C), Max:98 F (36.7 C)   Recent Labs Lab 05/21/15 0951 05/22/15 0556  WBC 8.9 8.2  CREATININE 0.84 1.04    Estimated Creatinine Clearance: 102.6 mL/min (by C-G formula based on Cr of 1.04).    No Known Allergies  Antimicrobials this admission: Levaquin 3/31 >>   Thank you for allowing pharmacy to be a part of this patient's care.  Cy Blamerllison K Oiva Dibari 05/23/2015 12:34 PM

## 2015-05-23 NOTE — Progress Notes (Signed)
Pt given 1 time breathing treatment. Tolerated well. Pt states that he is not in distress just some coughing. Pt doesn't wish to wear Bipap at this time but will wear it if he becomes distressed.

## 2015-05-23 NOTE — Progress Notes (Signed)
Patient is having expiratory wheezes bilaterally and there's no order for breathing treatment. Patient is complaining of bilateral leg crump, Dr. Loney Lohseni notified with new order for duoneb 3 mL  and magnesium level in the morning. Will continue to monitor.

## 2015-05-23 NOTE — Progress Notes (Signed)
[facility] [place] [facility]   PATIENT NAME: Timothy Roach    MR#:  [id number]  DATE OF BIRTH:  [birth year]  SUBJECTIVE:   Patient is less short of breath and feeling better overall. Telemetry shows that patient is in atrial fibrillation and heart rates are occasionally in the 110s.  REVIEW OF SYSTEMS:    Review of Systems  Constitutional: Negative for fever, chills and malaise/fatigue.  HENT: Negative for ear discharge, ear pain, hearing loss, nosebleeds and sore throat.   Eyes: Negative for blurred vision and pain.  Respiratory: Negative for cough, hemoptysis, shortness of breath (improved) and wheezing.   Cardiovascular: Negative for chest pain, palpitations and leg swelling (improved).  Gastrointestinal: Negative for nausea, vomiting, abdominal pain, diarrhea and blood in stool.  Genitourinary: Negative for dysuria.  Musculoskeletal: Negative for back pain.  Neurological: Negative for dizziness, tremors, speech change, focal weakness, seizures and headaches.  Endo/Heme/Allergies: Does not bruise/bleed easily.  Psychiatric/Behavioral: Negative for depression, suicidal ideas and hallucinations.    Tolerating Diet: Yes      DRUG ALLERGIES:  No Known Allergies  VITALS:  Blood pressure 160/138, pulse 87, temperature 97.6 F (36.4 C), temperature source Oral, resp. rate 22, height 6\' 1"  (1.854 m), weight 114.443 kg (252 lb 4.8 oz), SpO2 95 %.  PHYSICAL EXAMINATION:   Physical Exam  Constitutional: He is oriented to person, place, and time and well-developed, well-nourished, and in no distress. No distress.  HENT:  Head: Normocephalic.  Eyes: No scleral icterus.  Neck: Normal range of motion. Neck supple. No JVD present. No tracheal deviation present.  Cardiovascular: Normal rate, regular rhythm and normal heart sounds.  Exam reveals no gallop and no friction rub.   No murmur heard. Irr, irr tachy  Pulmonary/Chest: Effort normal and  breath sounds normal. No respiratory distress. He has no wheezes. He has no rales. He exhibits no tenderness.  Abdominal: Soft. Bowel sounds are normal. He exhibits no distension and no mass. There is no tenderness. There is no rebound and no guarding.  Musculoskeletal: Normal range of motion. He exhibits no edema.  Neurological: He is alert and oriented to person, place, and time.  Skin: Skin is warm. No rash noted. No erythema.  Psychiatric: Affect and judgment normal.      LABORATORY PANEL:   CBC  Recent Labs Lab 05/22/15 0556  WBC 8.2  HGB 13.6  HCT 39.2*  PLT 131*   ------------------------------------------------------------------------------------------------------------------  Chemistries   Recent Labs Lab 05/22/15 0556  NA 133*  K 4.2  CL 98*  CO2 25  GLUCOSE 103*  BUN 20  CREATININE 1.04  CALCIUM 8.4*  MG 2.0  AST 29  ALT 27  ALKPHOS 77  BILITOT 1.5*   ------------------------------------------------------------------------------------------------------------------  Cardiac Enzymes  Recent Labs Lab 05/21/15 1738 05/21/15 2319 05/22/15 0556  TROPONINI 0.03 <0.03 <0.03   ------------------------------------------------------------------------------------------------------------------  RADIOLOGY:  No results found.   ASSESSMENT AND PLAN:    60 year old male with chronic atrial fibrillation who presents with A. fib RVR.  1. Atrial fibrillation with RVR: Patient's heart rate is still elevated. Cardiac exam drip has been discontinued and changed to oral Cardizem. Patient will also be started on metoprolol. Appreciate cardiology consult.  Continue aspirin for chads vascular score of 1.  2. Acute hypoxic respiratory failure due to acute systolic heart failure with ejection fraction of 45-50% by echocardiogram: Continue IV Lasix 20 mg twice a day. Continue enalapril and metoprolol. Check chest x-ray. Continue to monitor input and  output.  3. Community-acquired pneumonia with wheezing on examination: Start DuoNeb's. Continue Levaquin Follow-up on Chest x-ray  4. Essential hypertension: Continue diltiazem, enalapril and metoprolol. 5. Hyponatremia: Monitor sodium level while on Lasix. Na level has improved since admission. Management plans discussed with the patient and he D.w dr Evette Georges is in agreement.  CODE STATUS: FULL  TOTAL TIME TAKING CARE OF THIS PATIENT: 29 minutes.     POSSIBLE D/C 1-2 days, DEPENDING ON CLINICAL CONDITION.   Fred Hammes M.D on 05/23/2015 at 11:56 AM  Between 7am to 6pm - Pager - [phone] After 6pm go to [email or web address] - password [email or web address] [facility]  [facility]  Office  [phone]  CC: Primary care physician; [facility]  Note: This dictation was prepared with Dragon dictation along with smaller phrase technology. Any transcriptional errors that result from this process are unintentional.

## 2015-05-23 NOTE — Progress Notes (Signed)
BP=94/63 and patient has Metoprolol 25 mg to be administered. HR fluctuating between 90-120. Dr. Loney Lohseni notified with a new order to give the Metoprolol as scheduled and continue to monitor.

## 2015-05-24 ENCOUNTER — Inpatient Hospital Stay: Payer: Managed Care, Other (non HMO)

## 2015-05-24 LAB — BASIC METABOLIC PANEL
Anion gap: 10 (ref 5–15)
BUN: 20 mg/dL (ref 6–20)
CALCIUM: 8.5 mg/dL — AB (ref 8.9–10.3)
CO2: 26 mmol/L (ref 22–32)
CREATININE: 0.86 mg/dL (ref 0.61–1.24)
Chloride: 95 mmol/L — ABNORMAL LOW (ref 101–111)
GFR calc non Af Amer: >60 mL/min (ref >60)
Glucose, Bld: 110 mg/dL — ABNORMAL HIGH (ref 65–99)
Potassium: 3.6 mmol/L (ref 3.5–5.1)
SODIUM: 131 mmol/L — AB (ref 135–145)

## 2015-05-24 MED ORDER — METOPROLOL TARTRATE 50 MG PO TABS
50.0000 mg | ORAL_TABLET | Freq: Two times a day (BID) | ORAL | Status: DC
  Administered 2015-05-24 – 2015-05-25 (×2): 50 mg via ORAL
  Filled 2015-05-24 (×2): qty 1

## 2015-05-24 MED ORDER — IPRATROPIUM-ALBUTEROL 0.5-2.5 (3) MG/3ML IN SOLN
3.0000 mL | Freq: Four times a day (QID) | RESPIRATORY_TRACT | Status: DC
  Administered 2015-05-24 – 2015-05-25 (×3): 3 mL via RESPIRATORY_TRACT
  Filled 2015-05-24 (×3): qty 3

## 2015-05-24 MED ORDER — METOPROLOL TARTRATE 25 MG PO TABS
25.0000 mg | ORAL_TABLET | Freq: Once | ORAL | Status: AC
Start: 2015-05-24 — End: 2015-05-24
  Administered 2015-05-24: 25 mg via ORAL
  Filled 2015-05-24: qty 1

## 2015-05-24 MED ORDER — LEVOFLOXACIN 750 MG PO TABS
750.0000 mg | ORAL_TABLET | Freq: Every day | ORAL | Status: DC
  Administered 2015-05-24: 750 mg via ORAL
  Filled 2015-05-24: qty 1

## 2015-05-24 NOTE — Progress Notes (Signed)
[facility]  SUBJECTIVE: I feel fine   Filed Vitals:   05/24/15 0419 05/24/15 0429 05/24/15 0828 05/24/15 0924  BP:   117/74 117/71  Pulse:  88 93 128  Temp:      TempSrc:      Resp:      Height:      Weight: 112.674 kg (248 lb 6.4 oz)     SpO2:         Intake/Output Summary (Last 24 hours) at 05/24/15 1115 Last data filed at 05/24/15 1042  Gross per 24 hour  Intake  770.5 ml  Output   1450 ml  Net -679.5 ml      PHYSICAL EXAM  General: Well developed, well nourished, in no acute distress HEENT:  Normocephalic and atramatic Neck:  No JVD.  Lungs: Clear bilaterally to auscultation and percussion. Heart: HRRR . Normal S1 and S2 without gallops or murmurs.  Abdomen: Bowel sounds are positive, abdomen soft and non-tender  Msk:  Back normal, normal gait. Normal strength and tone for age. Extremities: No clubbing, cyanosis or edema.   Neuro: Alert and oriented X 3. Psych:  Good affect, responds appropriately   LABS: Basic Metabolic Panel:  Recent Labs  04/54/0903/31/17 0556 05/24/15 0529  NA 133* 131*  K 4.2 3.6  CL 98* 95*  CO2 25 26  GLUCOSE 103* 110*  BUN 20 20  CREATININE 1.04 0.86  CALCIUM 8.4* 8.5*  MG 2.0  --    Liver Function Tests:  Recent Labs  05/22/15 0556  AST 29  ALT 27  ALKPHOS 77  BILITOT 1.5*  PROT 6.8  ALBUMIN 3.4*   No results for input(s): LIPASE, AMYLASE in the last 72 hours. CBC:  Recent Labs  05/22/15 0556  WBC 8.2  HGB 13.6  HCT 39.2*  MCV 94.8  PLT 131*   Cardiac Enzymes:  Recent Labs  05/21/15 1738 05/21/15 2319 05/22/15 0556  TROPONINI 0.03 <0.03 <0.03   BNP: Invalid input(s): POCBNP D-Dimer: No results for input(s): DDIMER in the last 72 hours. Hemoglobin A1C: No results for input(s): HGBA1C in the last 72 hours. Fasting Lipid Panel: No results for input(s): CHOL, HDL, LDLCALC, TRIG, CHOLHDL, LDLDIRECT in the last 72 hours. Thyroid Function Tests:  Recent Labs  05/22/15 0556  TSH 1.043   Anemia  Panel: No results for input(s): VITAMINB12, FOLATE, FERRITIN, TIBC, IRON, RETICCTPCT in the last 72 hours.  Dg Chest 1 View  05/24/2015  CLINICAL DATA:  Shortness of breath and cough. EXAM: CHEST 1 VIEW COMPARISON:  05/23/2015 and prior exams FINDINGS: Cardiomegaly, pulmonary vascular congestion and bilateral lower lung atelectasis/airspace disease again noted. There may be trace bilateral pleural effusions present. There is no evidence of pneumothorax. No other interval changes noted. IMPRESSION: Unchanged appearance of the chest with bilateral lower lung atelectasis/airspace and pulmonary vascular congestion. Electronically Signed   By: Harmon PierJeffrey  Hu M.D.   On: 05/24/2015 09:24   Dg Chest 1 View  05/23/2015  CLINICAL DATA:  Shortness of breath. Pneumonia. Atrial fibrillation with rapid ventricular response. EXAM: CHEST 1 VIEW COMPARISON:  05/21/2015 FINDINGS: Mild to moderate cardiomegaly remains stable. Decreased diffuse interstitial infiltrates and bibasilar airspace disease is seen, consistent with decreased pulmonary edema. Small right pleural effusion is slightly decreased in size. IMPRESSION: Congestive heart failure, with decreased diffuse pulmonary edema and tiny right pleural effusion. Electronically Signed   By: Myles RosenthalJohn  Stahl M.D.   On: 05/23/2015 12:43     Echo mildly reduced left ventricular function with  LV ejection fraction of 45-50% with moderate mitral regurgitation  TELEMETRY: Atrial fibrillation:  ASSESSMENT AND PLAN:  Active Problems:   Atrial fibrillation with RVR (HCC)    1. Chronic atrial fibrillation, chads Vasc of 1, on aspirin for stroke reduction, rate elevated secondary to underlying bronchitis/community acquired pneumonia, with element of chronic diastolic congestive heart failure, with persistent mild elevation in heart rate, asymptomatic  Recommendations  1. Continue aspirin for stroke  reduction 2. Continue Cardizem CD 240 mg daily 3. Increase metoprolol tartrate  50 mg twice a day 4. Follow-up with Dr. Gwen Pounds in one week  Signed off for now, please call if any questions  Kaydn Kumpf, MD, PhD, [facility] 05/24/2015 11:15 AM

## 2015-05-24 NOTE — Progress Notes (Signed)
[facility] - [place] at [facility]   PATIENT NAME: Timothy Roach    MR#:  [id number]  DATE OF BIRTH:  10/07/1955  SUBJECTIVE:  Patient feels well however heart rates jumped up to 140s on ambulation. Patient denies shortness of breath or chest pain or palpitations.  REVIEW OF SYSTEMS:    Review of Systems  Constitutional: Negative for fever, chills and malaise/fatigue.  HENT: Negative for ear discharge, ear pain, hearing loss, nosebleeds and sore throat.   Eyes: Negative for blurred vision and pain.  Respiratory: Negative for cough, hemoptysis, shortness of breath and wheezing.   Cardiovascular: Negative for chest pain, palpitations and leg swelling.  Gastrointestinal: Negative for nausea, vomiting, abdominal pain, diarrhea and blood in stool.  Genitourinary: Negative for dysuria.  Musculoskeletal: Negative for back pain.  Neurological: Negative for dizziness, tremors, speech change, focal weakness, seizures and headaches.  Endo/Heme/Allergies: Does not bruise/bleed easily.  Psychiatric/Behavioral: Negative for depression, suicidal ideas and hallucinations.    Tolerating Diet: Yes      DRUG ALLERGIES:  No Known Allergies  VITALS:  Blood pressure 117/71, pulse 128, temperature 97.7 F (36.5 C), temperature source Oral, resp. rate 24, height  (1.854 m), weight 112.674 kg (248 lb 6.4 oz), SpO2 93 %.  PHYSICAL EXAMINATION:   Physical Exam  Constitutional: He is oriented to person, place, and time and well-developed, well-nourished, and in no distress. No distress.  HENT:  Head: Normocephalic.  Eyes: No scleral icterus.  Neck: Normal range of motion. Neck supple. No JVD present. No tracheal deviation present.  Cardiovascular: Exam reveals no gallop and no friction rub.   No murmur heard. Irr, irr tachy  Pulmonary/Chest: Effort normal. No respiratory distress. He has no wheezes. He has no rales. He exhibits no tenderness.  Crackles right  side  Abdominal: Soft. Bowel sounds are normal. He exhibits no distension and no mass. There is no tenderness. There is no rebound and no guarding.  Musculoskeletal: Normal range of motion. He exhibits no edema.  Neurological: He is alert and oriented to person, place, and time.  Skin: Skin is warm. No rash noted. No erythema.  Psychiatric: Affect and judgment normal.      LABORATORY PANEL:   CBC  Recent Labs Lab 05/22/15 0556  WBC 8.2  HGB 13.6  HCT 39.2*  PLT 131*   ------------------------------------------------------------------------------------------------------------------  Chemistries   Recent Labs Lab 05/22/15 0556 05/24/15 0529  NA 133* 131*  K 4.2 3.6  CL 98* 95*  CO2 25 26  GLUCOSE 103* 110*  BUN 20 20  CREATININE 1.04 0.86  CALCIUM 8.4* 8.5*  MG 2.0  --   AST 29  --   ALT 27  --   ALKPHOS 77  --   BILITOT 1.5*  --    ------------------------------------------------------------------------------------------------------------------  Cardiac Enzymes  Recent Labs Lab 05/21/15 1738 05/21/15 2319 05/22/15 0556  TROPONINI 0.03 <0.03 <0.03   ------------------------------------------------------------------------------------------------------------------  RADIOLOGY:  Dg Chest 1 View  05/24/2015  CLINICAL DATA:  Shortness of breath and cough. EXAM: CHEST 1 VIEW COMPARISON:  05/23/2015 and prior exams FINDINGS: Cardiomegaly, pulmonary vascular congestion and bilateral lower lung atelectasis/airspace disease again noted. There may be trace bilateral pleural effusions present. There is no evidence of pneumothorax. No other interval changes noted. IMPRESSION: Unchanged appearance of the chest with bilateral lower lung atelectasis/airspace and pulmonary vascular congestion. Electronically Signed   By: Harmon Pier M.D.   On: 05/24/2015 09:24   Dg Chest 1 View  05/23/2015  CLINICAL DATA:  Shortness of breath. Pneumonia. Atrial fibrillation with rapid  ventricular response. EXAM: CHEST 1 VIEW COMPARISON:  05/21/2015 FINDINGS: Mild to moderate cardiomegaly remains stable. Decreased diffuse interstitial infiltrates and bibasilar airspace disease is seen, consistent with decreased pulmonary edema. Small right pleural effusion is slightly decreased in size. IMPRESSION: Congestive heart failure, with decreased diffuse pulmonary edema and tiny right pleural effusion. Electronically Signed   By: Myles RosenthalJohn  Stahl M.D.   On: 05/23/2015 12:43     ASSESSMENT AND PLAN:    60 year old male with chronic atrial fibrillation who presents with A. fib RVR.  1. Atrial fibrillation with RVR this is predominantly due to pulmonary process: Heart rate not yet controlled. Continue diltiazem and metoprolol. Consider adding digoxin or amiodarone. Cardiology recommendations to follow.. Continue aspirin for chads vascular score of 1.  2. Acute hypoxic respiratory failure due to acute systolic heart failure with ejection fraction of 45-50% by echocardiogram: Continue IV Lasix 20 mg twice a day. Continue enalapril and metoprolol. Chest x-ray shows resolving CHF.  Continue to monitor input and output.  3. Community-acquired pneumonia with wheezing on examination: Continue DuoNeb's. Continue Levaquin Add I assess.   4. Essential hypertension: Continue diltiazem, enalapril and metoprolol. 5. Hyponatremia: Sodium level 131 today. Monitor while on Lasix. Management plans discussed with the patient and he D.w dr Evette Georgesparashos is in agreement.  CODE STATUS: FULL  TOTAL TIME TAKING CARE OF THIS PATIENT: 25 minutes.     POSSIBLE D/C 1-2 days, DEPENDING ON CLINICAL CONDITION.   Alix Stowers M.D on 05/24/2015 at 10:56 AM  Between 7am to 6pm - Pager - [phone] After 6pm go to [email or web address] - password [email or web address]  [facility]  Office  [phone]  CC: Primary care physician; [facility]  Note: This dictation was prepared with Dragon  dictation along with smaller phrase technology. Any transcriptional errors that result from this process are unintentional.

## 2015-05-24 NOTE — Progress Notes (Signed)
Per Dr. Cassie FreerParachos patient is okay to walk around the unit. Per MD,he will adjust meds. Will continue to monitor patient. Rudean Haskellonnisha R Gabreille Dardis

## 2015-05-24 NOTE — Progress Notes (Signed)
Patient demanded to be walked around. As soon as the  RN stated walking patient, his HR went as high as 160.  Ambulation was terminated due to increase HR. Patient was asymptomatic with that episode. Will continue to monitor.

## 2015-05-24 NOTE — Progress Notes (Signed)
Per Dr. Darrold JunkerParaschos okay to give Metoprolol 25mg  once, patient received metoprolol 25 mg this am. Will continue to monitor patient. Rudean Haskellonnisha R Don Giarrusso

## 2015-05-24 NOTE — Progress Notes (Addendum)
Notified by CCMD patient's HR in the 150-160s. Patient HR is up in the 120s-130s with going to the bathroom. Patient is back in bed, asymptomatic. Educated the patient about staying in bed and use the urinal for now. Dr. Juliene PinaMody and Dr. Darrold JunkerParaschos has been made aware. No new orders at this time.

## 2015-05-24 NOTE — Progress Notes (Signed)
PHARMACIST - PHYSICIAN COMMUNICATION DR:   Juliene PinaMody CONCERNING: Antibiotic IV to Oral Route Change Policy  RECOMMENDATION: This patient is receiving Levofloxacin  by the intravenous route.  Based on criteria approved by the Pharmacy and Therapeutics Committee, the antibiotic(s) is/are being converted to the equivalent oral dose form(s).   DESCRIPTION: These criteria include:  Patient being treated for a respiratory tract infection, urinary tract infection, cellulitis or clostridium difficile associated diarrhea if on metronidazole  The patient is not neutropenic and does not exhibit a GI malabsorption state  The patient is eating (either orally or via tube) and/or has been taking other orally administered medications for a least 24 hours  The patient is improving clinically and has a Tmax < 100.5  If you have questions about this conversion, please contact the Pharmacy Department  []   [phone]( [phone] )  [facility] [x]   [phone]( [phone] )  [facility] []   [phone]( [phone] )  [facility] []   [phone]  [facility] []   [phone]( [phone] )  [facility]    Demetrius Charityeldrin D. Jaydence, PharmD, BCPS Clinical Pharmacist

## 2015-05-25 LAB — BASIC METABOLIC PANEL
Anion gap: 4 — ABNORMAL LOW (ref 5–15)
BUN: 19 mg/dL (ref 6–20)
CO2: 31 mmol/L (ref 22–32)
CREATININE: 1.07 mg/dL (ref 0.61–1.24)
Calcium: 8.3 mg/dL — ABNORMAL LOW (ref 8.9–10.3)
Chloride: 97 mmol/L — ABNORMAL LOW (ref 101–111)
GFR calc Af Amer: >60 mL/min (ref >60)
GLUCOSE: 105 mg/dL — AB (ref 65–99)
Potassium: 3.6 mmol/L (ref 3.5–5.1)
SODIUM: 132 mmol/L — AB (ref 135–145)

## 2015-05-25 MED ORDER — METOPROLOL TARTRATE 50 MG PO TABS: 50.0000 mg | ORAL_TABLET | Freq: Two times a day (BID) | ORAL | Status: AC

## 2015-05-25 MED ORDER — TIROFIBAN HCL IV 12.5 MG/250 ML
INTRAVENOUS | Status: AC
  Filled 2015-05-25: qty 250

## 2015-05-25 MED ORDER — LEVOFLOXACIN 750 MG PO TABS
750.0000 mg | ORAL_TABLET | Freq: Every day | ORAL | Status: DC
Start: 2015-05-25 — End: 2016-12-19

## 2015-05-25 MED ORDER — FUROSEMIDE 40 MG PO TABS
20.0000 mg | ORAL_TABLET | Freq: Two times a day (BID) | ORAL | Status: DC
Start: 2015-05-25 — End: 2016-12-19

## 2015-05-25 NOTE — Discharge Summary (Signed)
[facility] - [place] at [facility]   PATIENT NAME: Timothy Roach    MR#:  [id number]  DATE OF BIRTH:  1955/05/08  DATE OF ADMISSION:  05/21/2015 ADMITTING PHYSICIAN: Ramonita Lab, MD  DATE OF DISCHARGE: 05/25/2015  PRIMARY CARE PHYSICIAN: [facility]    ADMISSION DIAGNOSIS:  Acute congestive heart failure, unspecified congestive heart failure type (HCC) [I50.9]  DISCHARGE DIAGNOSIS:  Active Problems:   Atrial fibrillation with RVR (HCC)   SECONDARY DIAGNOSIS:   Past Medical History  Diagnosis Date  . Hypertension   . A-fib [facility])     HOSPITAL COURSE:    60 year old male with chronic atrial fibrillation who presents with A. fib RVR.  1. Atrial fibrillation with RVR this is predominantly due to pulmonary process:  Patient still has asymptomatic tachycardia. I spoke with Dr Tawni Carnes who recommended to continue on diltiazem and metoprolol and to have him follow-up with Dr Gwen Pounds this week, since he is asymptomatic with a rest and on ambulation. He will continue aspirin for chads vascular score of 1.  2. Acute hypoxic respiratory failure due to acute systolic heart failure with ejection fraction of 45-50% by echocardiogram: He was on IV Lasix for diuresis and has done well on this. He is transitioned to oral Lasix. On lung exam at discharge she has no crackles or rales.. Continue enalapril and metoprolol at discharge. Chest x-ray shows resolving CHF.   3. Community-acquired pneumonia: He was started on DuoNeb's and Levaquin. He is not hypoxic and will complete course of antibiotics.    4. Essential hypertension: Continue diltiazem and metoprolol. due to low normal blood pressure enalapril was discontinued for now. Discontinue resumed once his blood pressure has improved. 5. Hyponatremia: Sodium level has improved and is 132 discharge.   DISCHARGE CONDITIONS AND DIET:   Patient stable for discharge on heart healthy diet    CONSULTS OBTAINED:  Treatment Team:  Marcina Millard, MD  DRUG ALLERGIES:  No Known Allergies  DISCHARGE MEDICATIONS:   Current Discharge Medication List    START taking these medications   Details  furosemide (LASIX) 40 MG tablet Take 0.5 tablets (20 mg total) by mouth 2 (two) times daily. Qty: 30 tablet, Refills: 0    levofloxacin (LEVAQUIN) 750 MG tablet Take 1 tablet (750 mg total) by mouth daily. Qty: 4 tablet, Refills: 0    metoprolol (LOPRESSOR) 50 MG tablet Take 1 tablet (50 mg total) by mouth 2 (two) times daily. Qty: 60 tablet, Refills: 0      CONTINUE these medications which have NOT CHANGED   Details  aspirin EC 81 MG tablet Take 81 mg by mouth daily.    diltiazem (DILACOR XR) 240 MG 24 hr capsule Take 240 mg by mouth daily.    Multiple Vitamins-Minerals (MULTIVITAMIN WITH MINERALS) tablet Take 1 tablet by mouth daily.      STOP taking these medications     enalapril (VASOTEC) 10 MG tablet               Today   patient is doing well this morning. He wants to go home. He is asymptomatic from elevated heart rate. He denies shortness of breath or chest pain. He denies palpitations.Blood pressure 116/74, pulse 58, temperature 97.8 F (36.6 C), temperature source Oral, resp. rate 17, height  (1.854 m), weight 114.987 kg (253 lb 8 oz), SpO2 99 %.   REVIEW OF SYSTEMS:  Review of Systems  Constitutional: Negative for fever, chills and malaise/fatigue.  HENT: Negative for ear discharge, ear pain, hearing loss, nosebleeds and sore throat.   Eyes: Negative for blurred vision and pain.  Respiratory: Negative for cough, hemoptysis, shortness of breath and wheezing.   Cardiovascular: Negative for chest pain, palpitations and leg swelling.  Gastrointestinal: Negative for nausea, vomiting, abdominal pain, diarrhea and blood in stool.  Genitourinary: Negative for dysuria.  Musculoskeletal: Negative for back pain.  Neurological: Negative for  dizziness, tremors, speech change, focal weakness, seizures and headaches.  Endo/Heme/Allergies: Does not bruise/bleed easily.  Psychiatric/Behavioral: Negative for depression, suicidal ideas and hallucinations.     PHYSICAL EXAMINATION:  GENERAL:  60 y.o.-year-old patient lying in the bed with no acute distress.  NECK:  Supple, no jugular venous distention. No thyroid enlargement, no tenderness.  LUNGS: Normal breath sounds bilaterally, no wheezing, rales,rhonchi  No use of accessory muscles of respiration.  CARDIOVASCULAR: tachycardia, irregular irregular No murmurs, rubs, or gallops.  ABDOMEN: Soft, non-tender, non-distended. Bowel sounds present. No organomegaly or mass.  EXTREMITIES: No pedal edema, cyanosis, or clubbing.  PSYCHIATRIC: The patient is alert and oriented x 3.  SKIN: No obvious rash, lesion, or ulcer.   DATA REVIEW:   CBC  Recent Labs Lab 05/22/15 0556  WBC 8.2  HGB 13.6  HCT 39.2*  PLT 131*    Chemistries   Recent Labs Lab 05/22/15 0556  05/25/15 0521  NA 133*  < > 132*  K 4.2  < > 3.6  CL 98*  < > 97*  CO2 25  < > 31  GLUCOSE 103*  < > 105*  BUN 20  < > 19  CREATININE 1.04  < > 1.07  CALCIUM 8.4*  < > 8.3*  MG 2.0  --   --   AST 29  --   --   ALT 27  --   --   ALKPHOS 77  --   --   BILITOT 1.5*  --   --   < > = values in this interval not displayed.  Cardiac Enzymes  Recent Labs Lab 05/21/15 1738 05/21/15 2319 05/22/15 0556  TROPONINI 0.03 <0.03 <0.03    Microbiology Results  @  RADIOLOGY:  Dg Chest 1 View  05/24/2015  CLINICAL DATA:  Shortness of breath and cough. EXAM: CHEST 1 VIEW COMPARISON:  05/23/2015 and prior exams FINDINGS: Cardiomegaly, pulmonary vascular congestion and bilateral lower lung atelectasis/airspace disease again noted. There may be trace bilateral pleural effusions present. There is no evidence of pneumothorax. No other interval changes noted. IMPRESSION: Unchanged appearance of the chest with  bilateral lower lung atelectasis/airspace and pulmonary vascular congestion. Electronically Signed   By: Harmon Pier M.D.   On: 05/24/2015 09:24   Dg Chest 1 View  05/23/2015  CLINICAL DATA:  Shortness of breath. Pneumonia. Atrial fibrillation with rapid ventricular response. EXAM: CHEST 1 VIEW COMPARISON:  05/21/2015 FINDINGS: Mild to moderate cardiomegaly remains stable. Decreased diffuse interstitial infiltrates and bibasilar airspace disease is seen, consistent with decreased pulmonary edema. Small right pleural effusion is slightly decreased in size. IMPRESSION: Congestive heart failure, with decreased diffuse pulmonary edema and tiny right pleural effusion. Electronically Signed   By: Myles Rosenthal M.D.   On: 05/23/2015 12:43      Management plans discussed with the patient and he is in agreement. Stable for discharge home  Patient should follow up with CARDIOLOGY 3 days  CODE STATUS:     Code Status Orders        Start     Ordered  05/21/15 1718  Full code   Continuous     05/21/15 1717    Code Status History    Date Active Date Inactive Code Status Order ID Comments User Context   This patient has a current code status but no historical code status.      TOTAL TIME TAKING CARE OF THIS PATIENT: 35 minutes.    Note: This dictation was prepared with Dragon dictation along with smaller phrase technology. Any transcriptional errors that result from this process are unintentional.  Andersyn Fragoso M.D on 05/25/2015 at 11:28 AM  Between 7am to 6pm - Pager - [phone] After 6pm go to [email or web address] - password [email or web address] [facility]  [facility]  Office  [phone]  CC: Primary care physician; [facility]

## 2015-05-25 NOTE — Progress Notes (Signed)
Pt discharged to home via wc.  Instructions  given to pt.  Questions answered.  No distress.  

## 2015-05-26 LAB — CULTURE, BLOOD (ROUTINE X 2)
CULTURE: NO GROWTH
Culture: NO GROWTH

## 2016-10-04 IMAGING — DX DG CHEST 1V
1 series · 1 of 1 positions shown · non-contrast
Comparison: 05/23/2015 and prior exams

CLINICAL DATA: Shortness of breath and cough.

EXAM:
CHEST 1 VIEW

[chest ap]
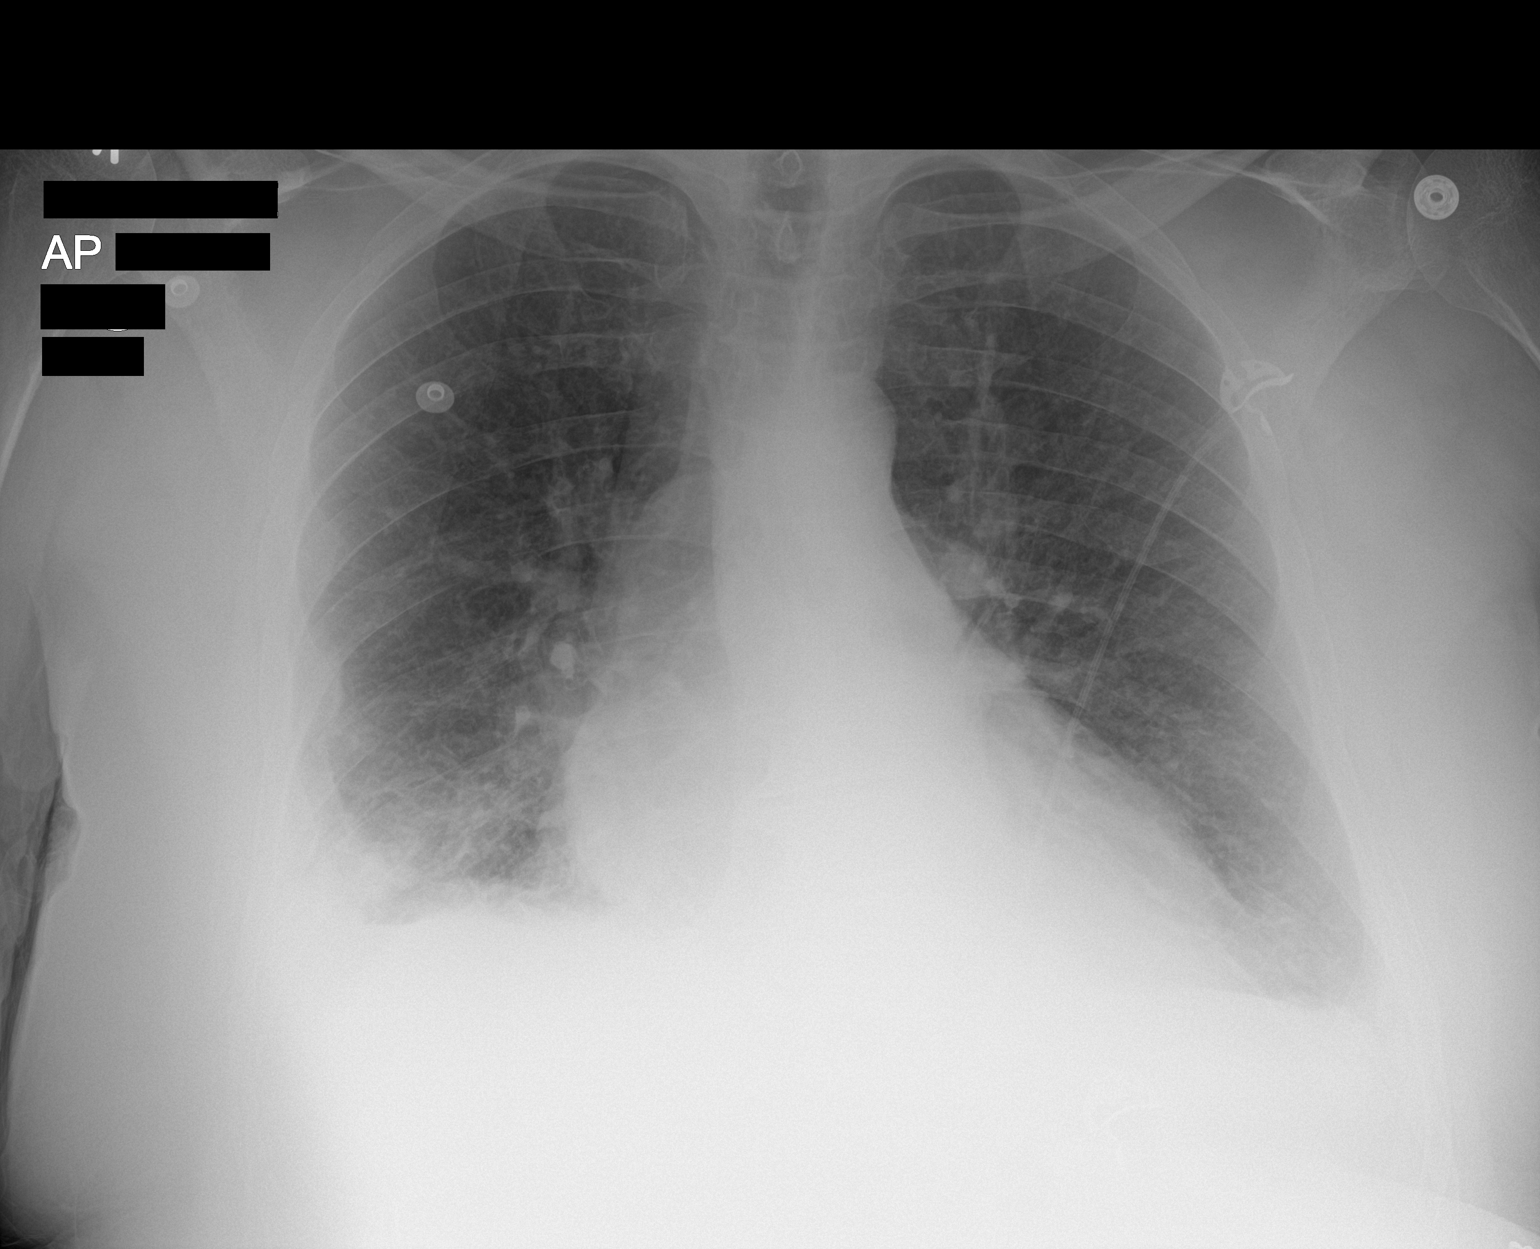

[1 of 1 positions shown; findings below may reference images not displayed]

FINDINGS: Cardiomegaly, pulmonary vascular congestion and bilateral lower lung
atelectasis/airspace disease again noted.

There may be trace bilateral pleural effusions present.

There is no evidence of pneumothorax.

No other interval changes noted.
IMPRESSION: Unchanged appearance of the chest with bilateral lower lung
atelectasis/airspace and pulmonary vascular congestion.

## 2016-12-19 ENCOUNTER — Encounter: Payer: Self-pay | Admitting: Family Medicine

## 2016-12-19 ENCOUNTER — Ambulatory Visit (INDEPENDENT_AMBULATORY_CARE_PROVIDER_SITE_OTHER): Payer: PRIVATE HEALTH INSURANCE | Admitting: Family Medicine

## 2016-12-19 VITALS — BP 116/79 | HR 81 | Temp 98.6°F | Ht 68.5 in | Wt 224.0 lb

## 2016-12-19 DIAGNOSIS — I10 Essential (primary) hypertension: Secondary | ICD-10-CM | POA: Diagnosis not present

## 2016-12-19 DIAGNOSIS — F3130 Bipolar disorder, current episode depressed, mild or moderate severity, unspecified: Secondary | ICD-10-CM

## 2016-12-19 DIAGNOSIS — Z7689 Persons encountering health services in other specified circumstances: Secondary | ICD-10-CM

## 2016-12-19 DIAGNOSIS — I4891 Unspecified atrial fibrillation: Secondary | ICD-10-CM

## 2016-12-19 DIAGNOSIS — F319 Bipolar disorder, unspecified: Secondary | ICD-10-CM

## 2016-12-19 MED ORDER — LAMOTRIGINE 25 MG PO TABS: 25.0000 mg | ORAL_TABLET | Freq: Every day | ORAL | 0 refills | Status: DC

## 2016-12-19 MED ORDER — OLANZAPINE 2.5 MG PO TABS: 2.5000 mg | ORAL_TABLET | Freq: Every day | ORAL | 0 refills | Status: DC

## 2016-12-19 NOTE — Progress Notes (Signed)
BP 116/79 (BP Location: Right Arm, Patient Position: Sitting, Cuff Size: Normal)   Pulse 81   Temp 98.6 F (37 C)   Ht 5' 8.5" (1.74 m)   Wt 224 lb (101.6 kg)   SpO2 97%   BMI 33.56 kg/m    Subjective:    Patient ID: Timothy Roach, male    DOB: [birth year], 61 y.o.   MRN: [id number]  HPI: Timothy Roach is a 61 y.o. male  Chief Complaint  Patient presents with  . Establish Care    Needs full blood panel drawn from sister.   . Depression    Patient didn't pay his bills and lost his house.   . Atrial Fibrillation   Patient presents to establish care. Previous PCP retired. Sister and oldest son come in with him today with concerns. Pt states it is ok for them to speak freely with care team.   Sister has noticed his entire life he has experienced extreme highs and lows - mania with overspending, not sleeping, severe rageful anger, screaming, cursing and then periods of low and depressed moods. Has been in a prolonged period of depression recently and was not keeping up with his home and car payments and has now lost both in the past few weeks. Patient states he was running low on money but didn't forget about the payments. Family members state he had the money and just forgot to pay. Memory and cognition are typically pretty intact. Was previously being treated for anxiety and depression with mild benefit. Depression runs in the family. Pt denies SI/HI, hallucinations.   Also has a hx of a fib, followed by a Cardiologist. Cardiologist took him off previous psychiatric medications due to interaction with diltiazem per pt. Denies CP, SOB, palpitations. Seems fairly well controlled on metoprolol and diltiazem. Declined anticoagulation per Cardiology notes, understands risks.   Hx of HTN, has been stable and well controlled. Not checking much at home.   Past Medical History:  Diagnosis Date  . A-fib (HCC)   . Anxiety   . Depression   . Hypertension   . [facility])    Social  History   Social History  . Marital status: Single    Spouse name: N/A  . Number of children: N/A  . Years of education: N/A   Occupational History  . Not on file.   Social History Main Topics  . Smoking status: Never Smoker  . Smokeless tobacco: Never Used  . Alcohol use No  . Drug use: No  . Sexual activity: Not on file   Other Topics Concern  . Not on file   Social History Narrative  . No narrative on file   Relevant past medical, surgical, family and social history reviewed and updated as indicated. Interim medical history since our last visit reviewed. Allergies and medications reviewed and updated.  Review of Systems  Constitutional: Positive for fatigue.  HENT: Negative.   Eyes: Negative.   Respiratory: Negative.   Cardiovascular: Negative.   Gastrointestinal: Negative.   Genitourinary: Negative.   Musculoskeletal: Negative.   Neurological: Negative.   Psychiatric/Behavioral: Positive for dysphoric mood. The patient is nervous/anxious.     Per HPI unless specifically indicated above     Objective:    BP 116/79 (BP Location: Right Arm, Patient Position: Sitting, Cuff Size: Normal)   Pulse 81   Temp 98.6 F (37 C)   Ht 5' 8.5" (1.74 m)   Wt 224 lb (101.6 kg)   SpO2 97%  BMI 33.56 kg/m   Wt Readings from Last 3 Encounters:  12/19/16 224 lb (101.6 kg)  05/25/15 253 lb 8 oz (115 kg)    Physical Exam  Constitutional: He is oriented to person, place, and time. He appears well-developed and well-nourished.  HENT:  Head: Atraumatic.  Eyes: Conjunctivae are normal. No scleral icterus.  Neck: Normal range of motion. Neck supple.  Cardiovascular: Normal rate.   Pulmonary/Chest: Effort normal and breath sounds normal.  Musculoskeletal: Normal range of motion.  Neurological: He is alert and oriented to person, place, and time.  Skin: Skin is warm and dry.  Psychiatric:  Very flat affect. Responds appropriately to questions. Receptive to family's  concerns  Nursing note and vitals reviewed.     Assessment & Plan:   Problem List Items Addressed This Visit      Cardiovascular and Mediastinum   Atrial fibrillation with RVR (HCC)    Followed by Cardiology. Under good control, continue current regimen      Hypertension    Stable and WNL, continue current regimen. DASH diet, increase physical activity        Other   Bipolar depression (HCC)    Will start low dose lamictal and zyprexa and monitor closely for benefit. Risks and benefits reviewed at length. Discussed with patient importance of diligent counseling in addition to medications. Referral placed for counseling today. Pt agreeable and wanting to get help.       Relevant Orders   Ambulatory referral to Psychology    Other Visit Diagnoses    Encounter to establish care    -  Primary       Follow up plan: Return in about 6 weeks (around 01/30/2017) for CPE, mood f/u.

## 2016-12-22 DIAGNOSIS — I10 Essential (primary) hypertension: Secondary | ICD-10-CM | POA: Insufficient documentation

## 2016-12-22 DIAGNOSIS — F319 Bipolar disorder, unspecified: Secondary | ICD-10-CM | POA: Insufficient documentation

## 2016-12-22 NOTE — Assessment & Plan Note (Signed)
Will start low dose lamictal and zyprexa and monitor closely for benefit. Risks and benefits reviewed at length. Discussed with patient importance of diligent counseling in addition to medications. Referral placed for counseling today. Pt agreeable and wanting to get help.

## 2016-12-22 NOTE — Patient Instructions (Signed)
Follow up for CPE, mood f/u

## 2016-12-22 NOTE — Assessment & Plan Note (Signed)
Stable and WNL, continue current regimen. DASH diet, increase physical activity

## 2016-12-22 NOTE — Assessment & Plan Note (Signed)
Followed by Cardiology. Under good control, continue current regimen

## 2017-01-17 ENCOUNTER — Ambulatory Visit: Payer: Self-pay | Admitting: Psychology

## 2017-01-19 ENCOUNTER — Encounter: Payer: Self-pay | Admitting: Family Medicine

## 2017-01-19 ENCOUNTER — Ambulatory Visit: Payer: PRIVATE HEALTH INSURANCE | Admitting: Family Medicine

## 2017-01-19 VITALS — BP 133/87 | HR 60 | Ht 68.5 in | Wt 234.0 lb

## 2017-01-19 DIAGNOSIS — F3130 Bipolar disorder, current episode depressed, mild or moderate severity, unspecified: Secondary | ICD-10-CM

## 2017-01-19 DIAGNOSIS — F319 Bipolar disorder, unspecified: Secondary | ICD-10-CM

## 2017-01-19 MED ORDER — LAMOTRIGINE 25 MG PO TABS: 50.0000 mg | ORAL_TABLET | Freq: Every day | ORAL | 2 refills | Status: DC

## 2017-01-19 MED ORDER — OLANZAPINE 5 MG PO TABS: 5.0000 mg | ORAL_TABLET | Freq: Every day | ORAL | 2 refills | Status: DC

## 2017-01-19 NOTE — Progress Notes (Signed)
   BP 133/87   Pulse 60   Ht 5' 8.5" (1.74 m)   Wt 234 lb (106.1 kg)   SpO2 99%   BMI 35.06 kg/m    Subjective:    Patient ID: Timothy Roach, male    DOB: 03/25/1955, 61 y.o.   MRN: [id number]  HPI: Timothy Roach is a 61 y.o. male  Chief Complaint  Patient presents with  . Depression   Patient presents for 1 month f/u bipolar depression after starting low dose lamictal and zyprexa. Doing incredibly well with new medication, no side effects and both pt and family members noticing major differences. Pt no longer feels sad, and no manic episodes since starting. Contacted a counselor from the list given. Counseling wanting to set him up for a visit first of the year to start counseling. Denies SI/HI, hallucinations, confusion.    Relevant past medical, surgical, family and social history reviewed and updated as indicated. Interim medical history since our last visit reviewed. Allergies and medications reviewed and updated.  Review of Systems  Constitutional: Negative.   Respiratory: Negative.   Cardiovascular: Negative.   Gastrointestinal: Negative.   Musculoskeletal: Negative.   Neurological: Negative.   Psychiatric/Behavioral: Negative.     Per HPI unless specifically indicated above     Objective:    BP 133/87   Pulse 60   Ht 5' 8.5" (1.74 m)   Wt 234 lb (106.1 kg)   SpO2 99%   BMI 35.06 kg/m   Wt Readings from Last 3 Encounters:  01/19/17 234 lb (106.1 kg)  12/19/16 224 lb (101.6 kg)  05/25/15 253 lb 8 oz (115 kg)    Physical Exam  Constitutional: He is oriented to person, place, and time. He appears well-developed and well-nourished. No distress.  HENT:  Head: Atraumatic.  Eyes: Conjunctivae are normal. No scleral icterus.  Neck: Normal range of motion. Neck supple.  Cardiovascular: Normal rate.  Pulmonary/Chest: Effort normal and breath sounds normal. No respiratory distress.  Musculoskeletal: Normal range of motion.  Neurological: He is alert and  oriented to person, place, and time.  Skin: Skin is warm and dry.  Psychiatric: He has a normal mood and affect. His behavior is normal.  Nursing note and vitals reviewed.     Assessment & Plan:   Problem List Items Addressed This Visit      Other   Bipolar depression (HCC) - Primary    Excellent response to current regimen, will increase both medications slightly and recheck in about 4-6 weeks. Pt will call and set up with counselor after the first of the year as recommended.           Follow up plan: Return in about 2 months (around 03/21/2017) for CPE and mood f/u.

## 2017-01-20 NOTE — Patient Instructions (Signed)
Follow up for CPE 

## 2017-01-20 NOTE — Assessment & Plan Note (Signed)
Excellent response to current regimen, will increase both medications slightly and recheck in about 4-6 weeks. Pt will call and set up with counselor after the first of the year as recommended.

## 2017-03-10 ENCOUNTER — Ambulatory Visit (INDEPENDENT_AMBULATORY_CARE_PROVIDER_SITE_OTHER): Payer: PRIVATE HEALTH INSURANCE | Admitting: Family Medicine

## 2017-03-10 ENCOUNTER — Encounter: Payer: Self-pay | Admitting: Family Medicine

## 2017-03-10 VITALS — BP 122/84 | HR 93 | Temp 97.5°F | Ht 69.25 in | Wt 255.2 lb

## 2017-03-10 DIAGNOSIS — Z0000 Encounter for general adult medical examination without abnormal findings: Secondary | ICD-10-CM | POA: Diagnosis not present

## 2017-03-10 DIAGNOSIS — I10 Essential (primary) hypertension: Secondary | ICD-10-CM | POA: Diagnosis not present

## 2017-03-10 DIAGNOSIS — F3130 Bipolar disorder, current episode depressed, mild or moderate severity, unspecified: Secondary | ICD-10-CM | POA: Diagnosis not present

## 2017-03-10 DIAGNOSIS — I4891 Unspecified atrial fibrillation: Secondary | ICD-10-CM | POA: Diagnosis not present

## 2017-03-10 DIAGNOSIS — F319 Bipolar disorder, unspecified: Secondary | ICD-10-CM

## 2017-03-10 LAB — UA/M W/RFLX CULTURE, ROUTINE
BILIRUBIN UA: NEGATIVE
Glucose, UA: NEGATIVE
Ketones, UA: NEGATIVE
Leukocytes, UA: NEGATIVE
Nitrite, UA: NEGATIVE
PH UA: 5.5 (ref 5.0–7.5)
Protein, UA: NEGATIVE
Specific Gravity, UA: 1.02 (ref 1.005–1.030)
Urobilinogen, Ur: 0.2 mg/dL (ref 0.2–1.0)

## 2017-03-10 LAB — MICROSCOPIC EXAMINATION: BACTERIA UA: NONE SEEN

## 2017-03-10 NOTE — Progress Notes (Signed)
BP 122/84 (BP Location: Left Arm, Patient Position: Sitting, Cuff Size: Large)   Pulse 93   Temp (!) 97.5 F (36.4 C) (Oral)   Ht 5' 9.25" (1.759 m)   Wt 255 lb 3.2 oz (115.8 kg)   SpO2 99%   BMI 37.41 kg/m    Subjective:    Patient ID: Timothy Roach, male    DOB: 1955-08-03, 62 y.o.   MRN: [id number]  HPI: Timothy Roach is a 62 y.o. male presenting on 03/10/2017 for comprehensive medical examination. Current medical complaints include:see below  Fasting for labs today.   BPs WNL at home when checked. Sees Dr. Gwen Pounds for HTN and atrial fibrillation. No side effects, compliant with medications. Denies CP, palpitations, SOB.   Moods under excellent control with zyprexa and lamictal. Family members have noticed a major change in him as has he. No manic episodes or dysphoria noted. Denies SI/HI.   Depression Screen done today and results listed below:  Depression screen [facility] 2/9 03/10/2017 12/19/2016  Decreased Interest 0 2  Down, Depressed, Hopeless 0 2  PHQ - 2 Score 0 4  Altered sleeping 0 1  Tired, decreased energy 0 1  Change in appetite 0 0  Feeling bad or failure about yourself  0 1  Trouble concentrating 0 1  Moving slowly or fidgety/restless 1 0  Suicidal thoughts 0 0  PHQ-9 Score 1 8    The patient does not have a history of falls. I did not complete a risk assessment for falls. A plan of care for falls was not documented.   Past Medical History:  Past Medical History:  Diagnosis Date  . A-fib (HCC)   . Anxiety   . Depression   . Hypertension   . Mania [facility])     Surgical History:  Past Surgical History:  Procedure Laterality Date  . HERNIA REPAIR    . TONSILLECTOMY      Medications:  Current Outpatient Medications on File Prior to Visit  Medication Sig  . aspirin EC 81 MG tablet Take 81 mg by mouth daily.  Marland Kitchen diltiazem (DILACOR XR) 240 MG 24 hr capsule Take 240 mg by mouth daily.  Marland Kitchen lamoTRIgine (LAMICTAL) 25 MG tablet Take 2 tablets (50 mg  total) by mouth daily.  . metoprolol (LOPRESSOR) 50 MG tablet Take 1 tablet (50 mg total) by mouth 2 (two) times daily.  . Multiple Vitamins-Minerals (MULTIVITAMIN WITH MINERALS) tablet Take 1 tablet by mouth daily.  Marland Kitchen OLANZapine (ZYPREXA) 5 MG tablet Take 1 tablet (5 mg total) by mouth at bedtime.   No current facility-administered medications on file prior to visit.     Allergies:  No Known Allergies  Social History:  Social History   Socioeconomic History  . Marital status: Single    Spouse name: Not on file  . Number of children: Not on file  . Years of education: Not on file  . Highest education level: Not on file  Social Needs  . Financial resource strain: Not on file  . Food insecurity - worry: Not on file  . Food insecurity - inability: Not on file  . Transportation needs - medical: Not on file  . Transportation needs - non-medical: Not on file  Occupational History  . Not on file  Tobacco Use  . Smoking status: Never Smoker  . Smokeless tobacco: Never Used  Substance and Sexual Activity  . Alcohol use: No  . Drug use: No  . Sexual activity: Not on file  Other Topics Concern  . Not on file  Social History Narrative  . Not on file   Social History   Tobacco Use  Smoking Status Never Smoker  Smokeless Tobacco Never Used   Social History   Substance and Sexual Activity  Alcohol Use No    Family History:  Family History  Problem Relation Age of Onset  . Diabetes Mother   . Vasculitis Father     Past medical history, surgical history, medications, allergies, family history and social history reviewed with patient today and changes made to appropriate areas of the chart.   Review of Systems - General ROS: negative Psychological ROS: negative Ophthalmic ROS: negative ENT ROS: negative Allergy and Immunology ROS: negative Respiratory ROS: no cough, shortness of breath, or wheezing Cardiovascular ROS: no chest pain or dyspnea on  exertion Gastrointestinal ROS: no abdominal pain, change in bowel habits, or black or bloody stools Genito-Urinary ROS: no dysuria, trouble voiding, or hematuria Musculoskeletal ROS: negative Neurological ROS: no TIA or stroke symptoms Dermatological ROS: negative All other ROS negative except what is listed above and in the HPI.      Objective:    BP 122/84 (BP Location: Left Arm, Patient Position: Sitting, Cuff Size: Large)   Pulse 93   Temp (!) 97.5 F (36.4 C) (Oral)   Ht 5' 9.25" (1.759 m)   Wt 255 lb 3.2 oz (115.8 kg)   SpO2 99%   BMI 37.41 kg/m   Wt Readings from Last 3 Encounters:  03/10/17 255 lb 3.2 oz (115.8 kg)  01/19/17 234 lb (106.1 kg)  12/19/16 224 lb (101.6 kg)    Physical Exam  Constitutional: He is oriented to person, place, and time. He appears well-developed and well-nourished. No distress.  HENT:  Head: Atraumatic.  Right Ear: External ear normal.  Left Ear: External ear normal.  Nose: Nose normal.  Mouth/Throat: Oropharynx is clear and moist.  Eyes: Conjunctivae are normal. Pupils are equal, round, and reactive to light. No scleral icterus.  Neck: Normal range of motion. Neck supple.  Cardiovascular: Normal rate, regular rhythm, normal heart sounds and intact distal pulses.  No murmur heard. Pulmonary/Chest: Effort normal and breath sounds normal. No respiratory distress.  Abdominal: Soft. Bowel sounds are normal. He exhibits no distension and no mass. There is no tenderness. There is no guarding.  Genitourinary:  Genitourinary Comments: GU exam declined by pt with shared decision making  Musculoskeletal: Normal range of motion. He exhibits no edema or tenderness.  Neurological: He is alert and oriented to person, place, and time. He has normal reflexes.  Skin: Skin is warm and dry. No rash noted.  Psychiatric: He has a normal mood and affect. His behavior is normal.  Nursing note and vitals reviewed.   Results for orders placed or performed in  visit on 03/10/17  Microscopic Examination  Result Value Ref Range   WBC, UA 0-5 0 - 5 /hpf   RBC, UA 0-2 0 - 2 /hpf   Epithelial Cells (non renal) 0-10 0 - 10 /hpf   Bacteria, UA None seen None seen/Few  CBC with Differential/Platelet  Result Value Ref Range   WBC 7.1 3.4 - 10.8 x10E3/uL   RBC 4.58 4.14 - 5.80 x10E6/uL   Hemoglobin 14.6 13.0 - 17.7 g/dL   Hematocrit 40.9 81.1 - 51.0 %   MCV 97 79 - 97 fL   MCH 31.9 26.6 - 33.0 pg   MCHC 32.7 31.5 - 35.7 g/dL   RDW 91.4 78.2 -  15.4 %   Platelets 199 150 - 379 x10E3/uL   Neutrophils 59 Not Estab. %   Lymphs 21 Not Estab. %   Monocytes 13 Not Estab. %   Eos 7 Not Estab. %   Basos 0 Not Estab. %   Neutrophils Absolute 4.2 1.4 - 7.0 x10E3/uL   Lymphocytes Absolute 1.5 0.7 - 3.1 x10E3/uL   Monocytes Absolute 0.9 0.1 - 0.9 x10E3/uL   EOS (ABSOLUTE) 0.5 (H) 0.0 - 0.4 x10E3/uL   Basophils Absolute 0.0 0.0 - 0.2 x10E3/uL   Immature Granulocytes 0 Not Estab. %   Immature Grans (Abs) 0.0 0.0 - 0.1 x10E3/uL  Comprehensive metabolic panel  Result Value Ref Range   Glucose 92 65 - 99 mg/dL   BUN 15 8 - 27 mg/dL   Creatinine, Ser 1.610.90 0.76 - 1.27 mg/dL   GFR calc non Af Amer 93 >59 mL/min/1.73   GFR calc Af Amer 107 >59 mL/min/1.73   BUN/Creatinine Ratio 17 10 - 24   Sodium 142 134 - 144 mmol/L   Potassium 4.4 3.5 - 5.2 mmol/L   Chloride 104 96 - 106 mmol/L   CO2 22 20 - 29 mmol/L   Calcium 9.0 8.6 - 10.2 mg/dL   Total Protein 7.2 6.0 - 8.5 g/dL   Albumin 4.3 3.6 - 4.8 g/dL   Globulin, Total 2.9 1.5 - 4.5 g/dL   Albumin/Globulin Ratio 1.5 1.2 - 2.2   Bilirubin Total 0.6 0.0 - 1.2 mg/dL   Alkaline Phosphatase 93 39 - 117 IU/L   AST 23 0 - 40 IU/L   ALT 19 0 - 44 IU/L  Lipid Panel w/o Chol/HDL Ratio  Result Value Ref Range   Cholesterol, Total 134 100 - 199 mg/dL   Triglycerides 096160 (H) 0 - 149 mg/dL   HDL 23 (L) >04>39 mg/dL   VLDL Cholesterol Cal 32 5 - 40 mg/dL   LDL Calculated 79 0 - 99 mg/dL  TSH  Result Value Ref Range    TSH 1.770 0.450 - 4.500 uIU/mL  UA/M w/rflx Culture, Routine  Result Value Ref Range   Specific Gravity, UA 1.020 1.005 - 1.030   pH, UA 5.5 5.0 - 7.5   Color, UA Yellow Yellow   Appearance Ur Clear Clear   Leukocytes, UA Negative Negative   Protein, UA Negative Negative/Trace   Glucose, UA Negative Negative   Ketones, UA Negative Negative   RBC, UA Trace (A) Negative   Bilirubin, UA Negative Negative   Urobilinogen, Ur 0.2 0.2 - 1.0 mg/dL   Nitrite, UA Negative Negative   Microscopic Examination See below:       Assessment & Plan:   Problem List Items Addressed This Visit      Cardiovascular and Mediastinum   Atrial fibrillation with RVR (HCC)    Stable, continue current regimen per Cardiology      Hypertension - Primary    Managed by Cardiology. Stable and WNL, continue current regimen and home monitoring. DASH diet, exercise as tolerated      Relevant Orders   CBC with Differential/Platelet (Completed)   Comprehensive metabolic panel (Completed)   UA/M w/rflx Culture, Routine (Completed)     Other   Bipolar depression (HCC)    Significant benefit with lamictal and zyprexa. Continue current regimen       Other Visit Diagnoses    Annual physical exam       Relevant Orders   Lipid Panel w/o Chol/HDL Ratio (Completed)   TSH (Completed)  Discussed aspirin prophylaxis for myocardial infarction prevention and decision was made to continue ASA  LABORATORY TESTING:  Health maintenance labs ordered today as discussed above.   The natural history of prostate cancer and ongoing controversy regarding screening and potential treatment outcomes of prostate cancer has been discussed with the patient. The meaning of a false positive PSA and a false negative PSA has been discussed. He indicates understanding of the limitations of this screening test and wishes not to proceed with screening PSA testing.   IMMUNIZATIONS:   - Tdap: Tetanus vaccination status reviewed:  last tetanus booster within 10 years. - Influenza: Refused  SCREENING: - Colonoscopy: Up to date  Discussed with patient purpose of the colonoscopy is to detect colon cancer at curable precancerous or early stages   PATIENT COUNSELING:    Sexuality: Discussed sexually transmitted diseases, partner selection, use of condoms, avoidance of unintended pregnancy  and contraceptive alternatives.   Advised to avoid cigarette smoking.  I discussed with the patient that most people either abstain from alcohol or drink within safe limits (<=14/week and <=4 drinks/occasion for males, <=7/weeks and <= 3 drinks/occasion for females) and that the risk for alcohol disorders and other health effects rises proportionally with the number of drinks per week and how often a drinker exceeds daily limits.  Discussed cessation/primary prevention of drug use and availability of treatment for abuse.   Diet: Encouraged to adjust caloric intake to maintain  or achieve ideal body weight, to reduce intake of dietary saturated fat and total fat, to limit sodium intake by avoiding high sodium foods and not adding table salt, and to maintain adequate dietary potassium and calcium preferably from fresh fruits, vegetables, and low-fat dairy products.    stressed the importance of regular exercise  Injury prevention: Discussed safety belts, safety helmets, smoke detector, smoking near bedding or upholstery.   Dental health: Discussed importance of regular tooth brushing, flossing, and dental visits.   Follow up plan: NEXT PREVENTATIVE PHYSICAL DUE IN 1 YEAR. Return in about 6 months (around 09/07/2017) for Mood f/u.

## 2017-03-11 LAB — TSH: TSH: 1.77 u[IU]/mL (ref 0.450–4.500)

## 2017-03-11 LAB — COMPREHENSIVE METABOLIC PANEL
A/G RATIO: 1.5 (ref 1.2–2.2)
ALT: 19 IU/L (ref 0–44)
AST: 23 IU/L (ref 0–40)
Albumin: 4.3 g/dL (ref 3.6–4.8)
Alkaline Phosphatase: 93 IU/L (ref 39–117)
BILIRUBIN TOTAL: 0.6 mg/dL (ref 0.0–1.2)
BUN / CREAT RATIO: 17 (ref 10–24)
BUN: 15 mg/dL (ref 8–27)
CHLORIDE: 104 mmol/L (ref 96–106)
CO2: 22 mmol/L (ref 20–29)
Calcium: 9 mg/dL (ref 8.6–10.2)
Creatinine, Ser: 0.9 mg/dL (ref 0.76–1.27)
GFR calc non Af Amer: 93 mL/min/{1.73_m2} (ref >59)
GFR, EST AFRICAN AMERICAN: 107 mL/min/{1.73_m2} (ref >59)
GLOBULIN, TOTAL: 2.9 g/dL (ref 1.5–4.5)
Glucose: 92 mg/dL (ref 65–99)
POTASSIUM: 4.4 mmol/L (ref 3.5–5.2)
SODIUM: 142 mmol/L (ref 134–144)
TOTAL PROTEIN: 7.2 g/dL (ref 6.0–8.5)

## 2017-03-11 LAB — CBC WITH DIFFERENTIAL/PLATELET
BASOS: 0 %
Basophils Absolute: 0 10*3/uL (ref 0.0–0.2)
EOS (ABSOLUTE): 0.5 10*3/uL — AB (ref 0.0–0.4)
Eos: 7 %
Hematocrit: 44.6 % (ref 37.5–51.0)
Hemoglobin: 14.6 g/dL (ref 13.0–17.7)
IMMATURE GRANS (ABS): 0 10*3/uL (ref 0.0–0.1)
Immature Granulocytes: 0 %
Lymphocytes Absolute: 1.5 10*3/uL (ref 0.7–3.1)
Lymphs: 21 %
MCH: 31.9 pg (ref 26.6–33.0)
MCHC: 32.7 g/dL (ref 31.5–35.7)
MCV: 97 fL (ref 79–97)
Monocytes Absolute: 0.9 10*3/uL (ref 0.1–0.9)
Monocytes: 13 %
NEUTROS ABS: 4.2 10*3/uL (ref 1.4–7.0)
Neutrophils: 59 %
PLATELETS: 199 10*3/uL (ref 150–379)
RBC: 4.58 x10E6/uL (ref 4.14–5.80)
RDW: 14 % (ref 12.3–15.4)
WBC: 7.1 10*3/uL (ref 3.4–10.8)

## 2017-03-11 LAB — LIPID PANEL W/O CHOL/HDL RATIO
Cholesterol, Total: 134 mg/dL (ref 100–199)
HDL: 23 mg/dL — AB (ref >39)
LDL Calculated: 79 mg/dL (ref 0–99)
Triglycerides: 160 mg/dL — ABNORMAL HIGH (ref 0–149)
VLDL Cholesterol Cal: 32 mg/dL (ref 5–40)

## 2017-03-12 NOTE — Assessment & Plan Note (Signed)
Significant benefit with lamictal and zyprexa. Continue current regimen

## 2017-03-12 NOTE — Patient Instructions (Signed)
Follow up in 6 months 

## 2017-03-12 NOTE — Assessment & Plan Note (Signed)
Managed by Cardiology. Stable and WNL, continue current regimen and home monitoring. DASH diet, exercise as tolerated

## 2017-03-12 NOTE — Assessment & Plan Note (Signed)
Stable, continue current regimen per Cardiology

## 2017-04-25 ENCOUNTER — Encounter: Payer: Self-pay | Admitting: Family Medicine

## 2017-05-19 ENCOUNTER — Other Ambulatory Visit: Payer: Self-pay

## 2017-05-19 MED ORDER — LAMOTRIGINE 25 MG PO TABS: 50.0000 mg | ORAL_TABLET | Freq: Every day | ORAL | 2 refills | Status: DC

## 2017-06-20 ENCOUNTER — Other Ambulatory Visit: Payer: Self-pay

## 2017-06-20 MED ORDER — OLANZAPINE 5 MG PO TABS: 5.0000 mg | ORAL_TABLET | Freq: Every day | ORAL | 2 refills | Status: DC

## 2017-06-20 MED ORDER — LAMOTRIGINE 25 MG PO TABS: 50.0000 mg | ORAL_TABLET | Freq: Every day | ORAL | 2 refills | Status: DC

## 2017-06-20 NOTE — Telephone Encounter (Signed)
Medication refill for Lamictal and Olanzapine. Last visit: 03/10/17 Upcoming appointment 09/26/17

## 2017-08-16 ENCOUNTER — Telehealth: Payer: Self-pay | Admitting: Family Medicine

## 2017-08-16 NOTE — Telephone Encounter (Signed)
Copied from CRM [id number]. Topic: Inquiry >> Aug 16, 2017  2:59 PM Yvonna Alanisobinson, Andra M wrote: Reason for CRM: Patient called requesting refills of LamoTRIgine (LAMICTAL) 25 MG tablet and OLANZapine (ZYPREXA) 5 MG tablet. [facility] [place] - [address] [phone] (Phone)  [phone] (Fax).  Thank You!!!

## 2017-08-17 MED ORDER — OLANZAPINE 5 MG PO TABS: 5.0000 mg | ORAL_TABLET | Freq: Every day | ORAL | 2 refills | Status: AC

## 2017-08-17 MED ORDER — LAMOTRIGINE 25 MG PO TABS: 50.0000 mg | ORAL_TABLET | Freq: Every day | ORAL | 2 refills | Status: AC

## 2017-08-17 NOTE — Telephone Encounter (Signed)
Pt is out of med. Pt is aware can take up to 3 business days °

## 2017-08-17 NOTE — Telephone Encounter (Signed)
Lamotrigine; last refill 06/20/17; #60;RF x 2 Olanzapine; last refill 06/20/17;#30; RFx 2   Last office visit; 03/10/17  PCP: Roosvelt Maserachel Lane  [facility] [place]  Above medications refilled per protocol

## 2017-09-07 ENCOUNTER — Ambulatory Visit: Payer: PRIVATE HEALTH INSURANCE | Admitting: Family Medicine

## 2017-09-26 ENCOUNTER — Ambulatory Visit: Payer: PRIVATE HEALTH INSURANCE | Admitting: Family Medicine

## 2018-07-19 ENCOUNTER — Ambulatory Visit: Payer: PRIVATE HEALTH INSURANCE | Admitting: Family Medicine

## 2018-07-21 ENCOUNTER — Other Ambulatory Visit: Payer: Self-pay

## 2018-07-21 ENCOUNTER — Emergency Department: Payer: PRIVATE HEALTH INSURANCE

## 2018-07-21 ENCOUNTER — Inpatient Hospital Stay
Admission: EM | Admit: 2018-07-21 | Discharge: 2018-07-23 | DRG: 871 | Disposition: E | Payer: PRIVATE HEALTH INSURANCE | Attending: Internal Medicine | Admitting: Internal Medicine

## 2018-07-21 DIAGNOSIS — R042 Hemoptysis: Secondary | ICD-10-CM | POA: Diagnosis present

## 2018-07-21 DIAGNOSIS — R6520 Severe sepsis without septic shock: Secondary | ICD-10-CM | POA: Diagnosis present

## 2018-07-21 DIAGNOSIS — G931 Anoxic brain damage, not elsewhere classified: Secondary | ICD-10-CM | POA: Diagnosis present

## 2018-07-21 DIAGNOSIS — A419 Sepsis, unspecified organism: Secondary | ICD-10-CM | POA: Diagnosis present

## 2018-07-21 DIAGNOSIS — Z515 Encounter for palliative care: Secondary | ICD-10-CM | POA: Diagnosis not present

## 2018-07-21 DIAGNOSIS — I509 Heart failure, unspecified: Secondary | ICD-10-CM | POA: Diagnosis present

## 2018-07-21 DIAGNOSIS — D65 Disseminated intravascular coagulation [defibrination syndrome]: Secondary | ICD-10-CM | POA: Diagnosis present

## 2018-07-21 DIAGNOSIS — R739 Hyperglycemia, unspecified: Secondary | ICD-10-CM | POA: Diagnosis present

## 2018-07-21 DIAGNOSIS — I469 Cardiac arrest, cause unspecified: Secondary | ICD-10-CM | POA: Diagnosis not present

## 2018-07-21 DIAGNOSIS — R29700 NIHSS score 0: Secondary | ICD-10-CM | POA: Diagnosis present

## 2018-07-21 DIAGNOSIS — I110 Hypertensive heart disease with heart failure: Secondary | ICD-10-CM | POA: Diagnosis present

## 2018-07-21 DIAGNOSIS — Z79899 Other long term (current) drug therapy: Secondary | ICD-10-CM

## 2018-07-21 DIAGNOSIS — I10 Essential (primary) hypertension: Secondary | ICD-10-CM | POA: Diagnosis present

## 2018-07-21 DIAGNOSIS — F329 Major depressive disorder, single episode, unspecified: Secondary | ICD-10-CM | POA: Diagnosis present

## 2018-07-21 DIAGNOSIS — Z7982 Long term (current) use of aspirin: Secondary | ICD-10-CM | POA: Diagnosis not present

## 2018-07-21 DIAGNOSIS — E875 Hyperkalemia: Secondary | ICD-10-CM | POA: Diagnosis present

## 2018-07-21 DIAGNOSIS — Z452 Encounter for adjustment and management of vascular access device: Secondary | ICD-10-CM

## 2018-07-21 DIAGNOSIS — I4891 Unspecified atrial fibrillation: Secondary | ICD-10-CM | POA: Diagnosis present

## 2018-07-21 DIAGNOSIS — J690 Pneumonitis due to inhalation of food and vomit: Secondary | ICD-10-CM | POA: Diagnosis present

## 2018-07-21 DIAGNOSIS — R6521 Severe sepsis with septic shock: Secondary | ICD-10-CM | POA: Diagnosis present

## 2018-07-21 DIAGNOSIS — I472 Ventricular tachycardia: Secondary | ICD-10-CM | POA: Diagnosis not present

## 2018-07-21 DIAGNOSIS — J9601 Acute respiratory failure with hypoxia: Secondary | ICD-10-CM | POA: Diagnosis present

## 2018-07-21 DIAGNOSIS — Z20828 Contact with and (suspected) exposure to other viral communicable diseases: Secondary | ICD-10-CM | POA: Diagnosis present

## 2018-07-21 DIAGNOSIS — N179 Acute kidney failure, unspecified: Secondary | ICD-10-CM | POA: Diagnosis present

## 2018-07-21 DIAGNOSIS — I6329 Cerebral infarction due to unspecified occlusion or stenosis of other precerebral arteries: Secondary | ICD-10-CM | POA: Diagnosis present

## 2018-07-21 DIAGNOSIS — Z66 Do not resuscitate: Secondary | ICD-10-CM | POA: Diagnosis not present

## 2018-07-21 DIAGNOSIS — J189 Pneumonia, unspecified organism: Secondary | ICD-10-CM | POA: Diagnosis present

## 2018-07-21 LAB — SARS CORONAVIRUS 2 BY RT PCR (HOSPITAL ORDER, PERFORMED IN ~~LOC~~ HOSPITAL LAB): SARS Coronavirus 2: NEGATIVE

## 2018-07-21 LAB — COMPREHENSIVE METABOLIC PANEL
ALT: 329 U/L — ABNORMAL HIGH (ref 0–44)
AST: 209 U/L — ABNORMAL HIGH (ref 15–41)
Albumin: 3.1 g/dL — ABNORMAL LOW (ref 3.5–5.0)
Alkaline Phosphatase: 133 U/L — ABNORMAL HIGH (ref 38–126)
Anion gap: 20 — ABNORMAL HIGH (ref 5–15)
BUN: 178 mg/dL — ABNORMAL HIGH (ref 8–23)
CO2: 16 mmol/L — ABNORMAL LOW (ref 22–32)
Calcium: 8.6 mg/dL — ABNORMAL LOW (ref 8.9–10.3)
Chloride: 95 mmol/L — ABNORMAL LOW (ref 98–111)
Creatinine, Ser: 5.13 mg/dL — ABNORMAL HIGH (ref 0.61–1.24)
GFR calc Af Amer: 13 mL/min — ABNORMAL LOW (ref >60)
GFR calc non Af Amer: 11 mL/min — ABNORMAL LOW (ref >60)
Glucose, Bld: 106 mg/dL — ABNORMAL HIGH (ref 70–99)
Potassium: 6.3 mmol/L (ref 3.5–5.1)
Sodium: 131 mmol/L — ABNORMAL LOW (ref 135–145)
Total Bilirubin: 7.2 mg/dL — ABNORMAL HIGH (ref 0.3–1.2)
Total Protein: 6.7 g/dL (ref 6.5–8.1)

## 2018-07-21 LAB — CBC WITH DIFFERENTIAL/PLATELET
Abs Immature Granulocytes: 0.07 10*3/uL (ref 0.00–0.07)
Basophils Absolute: 0 10*3/uL (ref 0.0–0.1)
Basophils Relative: 0 %
Eosinophils Absolute: 0 10*3/uL (ref 0.0–0.5)
Eosinophils Relative: 0 %
HCT: 54.1 % — ABNORMAL HIGH (ref 39.0–52.0)
Hemoglobin: 18.3 g/dL — ABNORMAL HIGH (ref 13.0–17.0)
Immature Granulocytes: 1 %
Lymphocytes Relative: 2 %
Lymphs Abs: 0.3 10*3/uL — ABNORMAL LOW (ref 0.7–4.0)
MCH: 32.4 pg (ref 26.0–34.0)
MCHC: 33.8 g/dL (ref 30.0–36.0)
MCV: 95.9 fL (ref 80.0–100.0)
Monocytes Absolute: 0.5 10*3/uL (ref 0.1–1.0)
Monocytes Relative: 4 %
Neutro Abs: 12.1 10*3/uL — ABNORMAL HIGH (ref 1.7–7.7)
Neutrophils Relative %: 93 %
Platelets: 76 10*3/uL — ABNORMAL LOW (ref 150–400)
RBC: 5.64 MIL/uL (ref 4.22–5.81)
RDW: 20.6 % — ABNORMAL HIGH (ref 11.5–15.5)
Smear Review: DECREASED
WBC: 13 10*3/uL — ABNORMAL HIGH (ref 4.0–10.5)
nRBC: 1.2 % — ABNORMAL HIGH (ref 0.0–0.2)

## 2018-07-21 LAB — BRAIN NATRIURETIC PEPTIDE: B Natriuretic Peptide: 1873 pg/mL — ABNORMAL HIGH (ref 0.0–100.0)

## 2018-07-21 LAB — TROPONIN I: Troponin I: 0.04 ng/mL (ref <0.03)

## 2018-07-21 MED ORDER — SODIUM CHLORIDE 0.9 % IV SOLN
500.0000 mg | INTRAVENOUS | Status: DC
  Administered 2018-07-22: 500 mg via INTRAVENOUS
  Filled 2018-07-21 (×2): qty 500

## 2018-07-21 MED ORDER — DILTIAZEM HCL 100 MG IV SOLR
5.0000 mg/h | INTRAVENOUS | Status: DC
  Administered 2018-07-21: 21:00:00 5 mg/h via INTRAVENOUS
  Filled 2018-07-21: qty 100

## 2018-07-21 MED ORDER — SODIUM CHLORIDE 0.9 % IV SOLN
Freq: Once | INTRAVENOUS | Status: AC
  Administered 2018-07-22: 01:00:00 via INTRAVENOUS

## 2018-07-21 MED ORDER — SODIUM CHLORIDE 0.9 % IV BOLUS: 1000.0000 mL | Freq: Once | INTRAVENOUS | Status: DC

## 2018-07-21 MED ORDER — DILTIAZEM HCL 25 MG/5ML IV SOLN
10.0000 mg | Freq: Once | INTRAVENOUS | Status: AC
  Administered 2018-07-21: 10 mg via INTRAVENOUS

## 2018-07-21 MED ORDER — SODIUM CHLORIDE 0.9 % IV BOLUS
1000.0000 mL | Freq: Once | INTRAVENOUS | Status: AC
  Administered 2018-07-21: 22:00:00 1000 mL via INTRAVENOUS

## 2018-07-21 MED ORDER — DILTIAZEM HCL 25 MG/5ML IV SOLN
INTRAVENOUS | Status: AC
  Filled 2018-07-21: qty 5

## 2018-07-21 MED ORDER — SODIUM CHLORIDE 0.9 % IV SOLN
1.0000 g | INTRAVENOUS | Status: DC
  Administered 2018-07-21: 23:00:00 1 g via INTRAVENOUS
  Filled 2018-07-21 (×2): qty 10

## 2018-07-21 MED ORDER — PATIROMER SORBITEX CALCIUM 8.4 G PO PACK
8.4000 g | PACK | Freq: Once | ORAL | Status: AC
  Administered 2018-07-21: 8.4 g via ORAL
  Filled 2018-07-21: qty 1

## 2018-07-21 NOTE — ED Notes (Signed)
Dark red blood noted to patient's lips and tongue. Patient denies coughing or vomiting blood. Patient reports 1 emesis this morning, however, reports there was no blood in it. MD Paduchowski informed.

## 2018-07-21 NOTE — ED Provider Notes (Signed)
Timothy Roach Emergency Department Provider Note  Time seen: 8:18 PM  I have reviewed the triage vital signs and the nursing notes.   HISTORY  Chief Complaint Shortness of Breath   HPI Timothy Roach is a 63 y.o. male with a past medical history of atrial fibrillation, anxiety, depression, hypertension, bipolar, presents to the emergency department with shortness of breath.  According to the patient over the past 2 days he has been feeling short of breath, states over the past 2 days he has noticed significant leg swelling and discoloration of his toes.  Patient denies any chest pain or abdominal pain.  Denies any fever or cough.  Patient is hypoxic 84% on room air upon arrival.   Past Medical History:  Diagnosis Date  . A-fib (HCC)   . Anxiety   . Depression   . Hypertension   . Mania [facility])     Patient Active Problem List   Diagnosis Date Noted  . Hypertension 12/22/2016  . Bipolar depression (HCC) 12/22/2016  . Atrial fibrillation with RVR (HCC) 05/21/2015    Past Surgical History:  Procedure Laterality Date  . HERNIA REPAIR    . TONSILLECTOMY      Prior to Admission medications   Medication Sig Start Date End Date Taking? Authorizing Provider  aspirin EC 81 MG tablet Take 81 mg by mouth daily.    [provider]  diltiazem (DILACOR XR) 240 MG 24 hr capsule Take 240 mg by mouth daily.    [provider]  lamoTRIgine (LAMICTAL) 25 MG tablet Take 2 tablets (50 mg total) by mouth daily. 08/17/17   Particia Nearing, PA-C  metoprolol (LOPRESSOR) 50 MG tablet Take 1 tablet (50 mg total) by mouth 2 (two) times daily. 05/25/15   Adrian Saran, MD  Multiple Vitamins-Minerals (MULTIVITAMIN WITH MINERALS) tablet Take 1 tablet by mouth daily.    [provider]  OLANZapine (ZYPREXA) 5 MG tablet Take 1 tablet (5 mg total) by mouth at bedtime. 08/17/17   Particia Nearing, PA-C    No Known Allergies  Family History  Problem  Relation Age of Onset  . Diabetes Mother   . Vasculitis Father     Social History Social History   Tobacco Use  . Smoking status: Never Smoker  . Smokeless tobacco: Never Used  Substance Use Topics  . Alcohol use: No  . Drug use: No    Review of Systems Constitutional: Negative for fever. ENT: Negative for recent illness/congestion Cardiovascular: Negative for chest pain. Respiratory: Positive for shortness of breath. Gastrointestinal: Negative for abdominal pain Musculoskeletal: Positive for lower extremity swelling Skin: Skin discoloration of the toes. Neurological: Negative for headache All other ROS negative  ____________________________________________   PHYSICAL EXAM:  VITAL SIGNS: ED Triage Vitals  Enc Vitals Group     BP 08-07-18 1955 140/66     Pulse Rate 2018/08/07 1955 (!) 111     Resp 08-07-18 1955 (!) 25     Temp 2018-08-07 1955 98.2 F (36.8 C)     Temp Source 2018/08/07 1955 Oral     SpO2 Aug 07, 2018 1955 (!) 84 %     Weight 08/07/18 1952 250 lb (113.4 kg)     Height 07-Aug-2018 1952  (1.854 m)     Head Circumference --      Peak Flow --      Pain Score Aug 07, 2018 1950 0     Pain Loc --      Pain Edu? --  Excl. in GC? --    Constitutional: Patient is awake and alert, no acute distress. Eyes: Normal exam ENT      Head: Normocephalic and atraumatic      Mouth/Throat: Mucous membranes are moist. Cardiovascular: Fast heart rate currently regular rhythm around 150 bpm Respiratory: Normal respiratory effort without tachypnea nor retractions. Breath sounds are clear Gastrointestinal: Soft and nontender. No distention.  Musculoskeletal: Patient has 3+ lower extremity edema, equal bilaterally.  Has purple/dark discoloration around his feet and toes bilaterally. Neurologic:  Normal speech and language. No gross focal neurologic deficits  Skin:  Skin is warm Psychiatric: Mood and affect are normal.  ____________________________________________     EKG  EKG viewed and interpreted by myself shows atrial fibrillation with a rapid ventricular response at 156 bpm with a widened QRS, normal axis, largely normal intervals otherwise with nonspecific ST changes most consistent with demand ischemia.  ____________________________________________    RADIOLOGY  Chest x-ray shows cardiomegaly with small pleural effusions.  ____________________________________________   INITIAL IMPRESSION / ASSESSMENT AND PLAN / ED COURSE  Pertinent labs & imaging results that were available during my care of the patient were reviewed by me and considered in my medical decision making (see chart for details).   Patient presents to the emergency department several days of shortness of breath worsening lower extremity swelling and discoloration.  Patient states that discoloration and swelling have only been going on for 1-1/2 days per patient.  Patient's feet appear quite discolored, but are warm.  Much of which appears chronic however patient states only 2 days of symptoms.  Patient has 3+ lower extremity pitting edema bilaterally.  Again states only 2 days.  Differential this time include ACS, A. fib with RVR leading to CHF and peripheral edema.  We will check labs including cardiac enzymes, chest x-ray we will dose IV diltiazem and continue to closely monitor.  Patient placed on supplemental oxygen via nasal cannula.  Chest x-ray shows cardiomegaly with small pleural effusions.  Patient's BNP is elevated at 1800.  Patient's labs show acute renal failure with a creatinine greater than 5, as well as hyperkalemia likely resulting from the renal failure.  We will treat with IV fluids.  Anion gap of 20 with a bicarb of 16.  Patient will require admission to the hospital for continued work-up and treatment.  Patient remains in atrial fibrillation with rapid ventricular response, minimal response after 10 mg of IV diltiazem.  We will placed on diltiazem infusion.  Timothy Roach was evaluated in Emergency Department on 07/07/2018 for the symptoms described in the history of present illness. He was evaluated in the context of the global COVID-19 pandemic, which necessitated consideration that the patient might be at risk for infection with the SARS-CoV-2 virus that causes COVID-19. Institutional protocols and algorithms that pertain to the evaluation of patients at risk for COVID-19 are in a state of rapid change based on information released by regulatory bodies including the CDC and federal and state organizations. These policies and algorithms were followed during the patient's care in the ED.  CRITICAL CARE Performed by: Minna Antis   Total critical care time: 45 minutes  Critical care time was exclusive of separately billable procedures and treating other patients.  Critical care was necessary to treat or prevent imminent or life-threatening deterioration.  Critical care was time spent personally by me on the following activities: development of treatment plan with patient and/or surrogate as well as nursing, discussions with consultants, evaluation of patient's  response to treatment, examination of patient, obtaining history from patient or surrogate, ordering and performing treatments and interventions, ordering and review of laboratory studies, ordering and review of radiographic studies, pulse oximetry and re-evaluation of patient's condition.   ____________________________________________   FINAL CLINICAL IMPRESSION(S) / ED DIAGNOSES  Atrial fibrillation rapid ventricular response Hypoxia Peripheral edema Acute renal failure Hyperkalemia   Minna AntisPaduchowski, Jeshawn Melucci, MD 06/30/2018 [id number]

## 2018-07-21 NOTE — ED Triage Notes (Signed)
Patient reports feeling short of breath for a day.  Patient also noted to have swollen, discolored feet that he reports have been this way for a day.

## 2018-07-21 NOTE — H&P (Addendum)
[facility] - [place] at [facility]   PATIENT NAME: Timothy Roach    MR#:  [id number]  DATE OF BIRTH:  09/19/1955  DATE OF ADMISSION:  07/20/2018  PRIMARY CARE PHYSICIAN: Particia Nearing, PA-C   REQUESTING/REFERRING PHYSICIAN: Lenard Lance, MD  CHIEF COMPLAINT:   Chief Complaint  Patient presents with  . Shortness of Breath    HISTORY OF PRESENT ILLNESS:  Timothy Roach  is a 62 y.o. male who presents with chief complaint as above.  Patient presents the ED with few days progressive symptoms of malaise and cough.  Today he became more short of breath and developed some hemoptysis.  On evaluation here in the ED he was found to be in A. fib with RVR.  On imaging it seems that he has some likely pneumonia.  He meets severe sepsis criteria.  He also has AKI and some hyperkalemia.  Hospitalist were called for admission.  PAST MEDICAL HISTORY:   Past Medical History:  Diagnosis Date  . A-fib (HCC)   . Anxiety   . Depression   . Hypertension   . Mania (HCC)      PAST SURGICAL HISTORY:   Past Surgical History:  Procedure Laterality Date  . HERNIA REPAIR    . TONSILLECTOMY       SOCIAL HISTORY:   Social History   Tobacco Use  . Smoking status: Never Smoker  . Smokeless tobacco: Never Used  Substance Use Topics  . Alcohol use: No     FAMILY HISTORY:   Family History  Problem Relation Age of Onset  . Diabetes Mother   . Vasculitis Father      DRUG ALLERGIES:  No Known Allergies  MEDICATIONS AT HOME:   Prior to Admission medications   Medication Sig Start Date End Date Taking? Authorizing Provider  aspirin EC 81 MG tablet Take 81 mg by mouth daily.    [provider]  diltiazem (DILACOR XR) 240 MG 24 hr capsule Take 240 mg by mouth daily.    [provider]  lamoTRIgine (LAMICTAL) 25 MG tablet Take 2 tablets (50 mg total) by mouth daily. 08/17/17   Particia Nearing, PA-C  metoprolol (LOPRESSOR) 50 MG  tablet Take 1 tablet (50 mg total) by mouth 2 (two) times daily. 05/25/15   Adrian Saran, MD  Multiple Vitamins-Minerals (MULTIVITAMIN WITH MINERALS) tablet Take 1 tablet by mouth daily.    [provider]  OLANZapine (ZYPREXA) 5 MG tablet Take 1 tablet (5 mg total) by mouth at bedtime. 08/17/17   Particia Nearing, PA-C    REVIEW OF SYSTEMS:  Review of Systems  Constitutional: Positive for malaise/fatigue. Negative for chills, fever and weight loss.  HENT: Negative for ear pain, hearing loss and tinnitus.   Eyes: Negative for blurred vision, double vision, pain and redness.  Respiratory: Positive for cough, hemoptysis and shortness of breath.   Cardiovascular: Negative for chest pain, palpitations, orthopnea and leg swelling.  Gastrointestinal: Negative for abdominal pain, constipation, diarrhea, nausea and vomiting.  Genitourinary: Negative for dysuria, frequency and hematuria.  Musculoskeletal: Negative for back pain, joint pain and neck pain.  Skin:       No acne, rash, or lesions  Neurological: Negative for dizziness, tremors, focal weakness and weakness.  Endo/Heme/Allergies: Negative for polydipsia. Does not bruise/bleed easily.  Psychiatric/Behavioral: Negative for depression. The patient is not nervous/anxious and does not have insomnia.      VITAL SIGNS:   Vitals:   07/02/2018 2100 06/24/2018 2130  07/20/2018 2200 07/06/2018 2235  BP: 107/80 102/77 (!) 108/97 115/80  Pulse: (!) 141  (!) 126 (!) 140  Resp: (!) 39 (!) 36 (!) 36 18  Temp:      TempSrc:      SpO2: 99%  100% 100%  Weight:      Height:       Wt Readings from Last 3 Encounters:  07/02/2018 113.4 kg  03/10/17 115.8 kg  01/19/17 106.1 kg    PHYSICAL EXAMINATION:  Physical Exam  Vitals reviewed. Constitutional: He is oriented to person, place, and time. He appears well-developed and well-nourished. No distress.  HENT:  Head: Normocephalic and atraumatic.  Mouth/Throat: Oropharynx is clear and moist.   Eyes: Pupils are equal, round, and reactive to light. Conjunctivae and EOM are normal. No scleral icterus.  Neck: Normal range of motion. Neck supple. No JVD present. No thyromegaly present.  Cardiovascular: Intact distal pulses. Exam reveals no gallop and no friction rub.  No murmur heard. Tachycardic, irregular rhythm  Respiratory: He is in respiratory distress. He has no wheezes. He has no rales.  Bibasilar coarse breath sounds  GI: Soft. Bowel sounds are normal. He exhibits no distension. There is no abdominal tenderness.  Musculoskeletal: Normal range of motion.        General: No edema.     Comments: No arthritis, no gout  Lymphadenopathy:    He has no cervical adenopathy.  Neurological: He is alert and oriented to person, place, and time. No cranial nerve deficit.  No dysarthria, no aphasia  Skin: Skin is warm and dry. No rash noted. No erythema.  Psychiatric: He has a normal mood and affect. His behavior is normal. Judgment and thought content normal.    LABORATORY PANEL:   CBC Recent Labs  Lab 07/18/2018 2020  WBC 13.0*  HGB 18.3*  HCT 54.1*  PLT 76*   ------------------------------------------------------------------------------------------------------------------  Chemistries  Recent Labs  Lab 06/23/2018 2020  NA 131*  K 6.3*  CL 95*  CO2 16*  GLUCOSE 106*  BUN 178*  CREATININE 5.13*  CALCIUM 8.6*  AST 209*  ALT 329*  ALKPHOS 133*  BILITOT 7.2*   ------------------------------------------------------------------------------------------------------------------  Cardiac Enzymes Recent Labs  Lab 06/27/2018 2020  TROPONINI 0.04*   ------------------------------------------------------------------------------------------------------------------  RADIOLOGY:  Dg Chest Portable 1 View  Result Date: 06/26/2018 CLINICAL DATA:  Shortness of breath EXAM: PORTABLE CHEST 1 VIEW COMPARISON:  05/24/2015 FINDINGS: Cardiomegaly with suspected small pleural  effusions. Patchy bibasilar airspace disease. No overt edema. No pneumothorax. IMPRESSION: Cardiomegaly with suspected small pleural effusions. Basilar airspace disease, atelectasis versus pneumonia Electronically Signed   By: Jasmine Pang M.D.   On: 07/09/2018 20:40    EKG:   Orders placed or performed during the hospital encounter of 07/08/2018  . ED EKG  . ED EKG  . EKG 12-Lead  . EKG 12-Lead    IMPRESSION AND PLAN:  Principal Problem:   Severe sepsis (HCC) -IV antibiotics ordered, lactic acid was initially elevated, will administer IV fluids and check lactic acid until within normal limits, blood pressure has been stable, sepsis due to pneumonia.  Cultures ordered Active Problems:   Atrial fibrillation with RVR (HCC) -patient required Cardizem drip to start correcting his heart rate.  His heart rate responded well to Cardizem drip, will keep this in place for now.  Troponin was mildly elevated, we will trend this value tonight.  We will get a cardiology consult   Hyperkalemia -patient was given temporizing medications in the ED, and  then Veltassa was ordered.  We will monitor his potassium level and continue to treat as needed   CAP (community acquired pneumonia) -IV antibiotics as above, PRN supportive treatment   AKI (acute kidney injury) (HCC) -IV fluids as above, avoid nephrotoxins and monitor for improvement.  If his renal function is not improving he will need a nephrology consult   Hypertension -continue home meds  Acute respiratory failure with hypoxia -during the patient's stay in the ED prior to moving him to the floor his respiratory status declined and he required intubation and mechanical ventilation.  Admission status will be changed to ICU.  Continue other treatment as above  Chart review performed and case discussed with ED provider. Labs, imaging and/or ECG reviewed by provider and discussed with patient/family. Management plans discussed with the patient and/or  family.  COVID-19 status: Tested negative     DVT PROPHYLAXIS: SubQ heparin  GI PROPHYLAXIS:  None  ADMISSION STATUS: Inpatient     CODE STATUS: Full Code Status History    Date Active Date Inactive Code Status Order ID Comments User Context   05/21/2015 1717 05/25/2015 1434 Full Code [id number]  Ramonita LabGouru, Aruna, MD Inpatient      TOTAL CRITICAL CARE TIME TAKING CARE OF THIS PATIENT: 50 minutes.   This patient was evaluated in the context of the global COVID-19 pandemic, which necessitated consideration that the patient might be at risk for infection with the SARS-CoV-2 virus that causes COVID-19. Institutional protocols and algorithms that pertain to the evaluation of patients at risk for COVID-19 are in a state of rapid change based on information released by regulatory bodies including the CDC and federal and state organizations. These policies and algorithms were followed to the best of this provider's knowledge to date during the patient's care at this facility.  Barney DrainDavid F Jasslyn Finkel 07/20/2018, 11:19 PM  [facility]  Office  [phone]  CC: Primary care physician; Particia NearingLane, Rachel Elizabeth, PA-C  Note:  This document was prepared using Dragon voice recognition software and may include unintentional dictation errors.

## 2018-07-22 ENCOUNTER — Inpatient Hospital Stay: Payer: PRIVATE HEALTH INSURANCE

## 2018-07-22 DIAGNOSIS — A419 Sepsis, unspecified organism: Principal | ICD-10-CM

## 2018-07-22 DIAGNOSIS — J9601 Acute respiratory failure with hypoxia: Secondary | ICD-10-CM | POA: Diagnosis present

## 2018-07-22 DIAGNOSIS — R6520 Severe sepsis without septic shock: Secondary | ICD-10-CM

## 2018-07-22 LAB — COMPREHENSIVE METABOLIC PANEL
ALT: 257 U/L — ABNORMAL HIGH (ref 0–44)
AST: 313 U/L — ABNORMAL HIGH (ref 15–41)
Albumin: 1.9 g/dL — ABNORMAL LOW (ref 3.5–5.0)
Alkaline Phosphatase: 92 U/L (ref 38–126)
Anion gap: 22 — ABNORMAL HIGH (ref 5–15)
BUN: 177 mg/dL — ABNORMAL HIGH (ref 8–23)
CO2: 22 mmol/L (ref 22–32)
Calcium: 7.8 mg/dL — ABNORMAL LOW (ref 8.9–10.3)
Chloride: 97 mmol/L — ABNORMAL LOW (ref 98–111)
Creatinine, Ser: 5.09 mg/dL — ABNORMAL HIGH (ref 0.61–1.24)
GFR calc Af Amer: 13 mL/min — ABNORMAL LOW (ref >60)
GFR calc non Af Amer: 11 mL/min — ABNORMAL LOW (ref >60)
Glucose, Bld: 113 mg/dL — ABNORMAL HIGH (ref 70–99)
Potassium: 5.7 mmol/L — ABNORMAL HIGH (ref 3.5–5.1)
Sodium: 141 mmol/L (ref 135–145)
Total Bilirubin: 6.1 mg/dL — ABNORMAL HIGH (ref 0.3–1.2)
Total Protein: 4.5 g/dL — ABNORMAL LOW (ref 6.5–8.1)

## 2018-07-22 LAB — MAGNESIUM: Magnesium: 4.6 mg/dL — ABNORMAL HIGH (ref 1.7–2.4)

## 2018-07-22 LAB — PROTIME-INR
INR: 5.3 (ref 0.8–1.2)
Prothrombin Time: 47.4 seconds — ABNORMAL HIGH (ref 11.4–15.2)

## 2018-07-22 LAB — CBC
HCT: 46.3 % (ref 39.0–52.0)
Hemoglobin: 15 g/dL (ref 13.0–17.0)
MCH: 32.4 pg (ref 26.0–34.0)
MCHC: 32.4 g/dL (ref 30.0–36.0)
MCV: 100 fL (ref 80.0–100.0)
Platelets: 54 10*3/uL — ABNORMAL LOW (ref 150–400)
RBC: 4.63 MIL/uL (ref 4.22–5.81)
RDW: 20.4 % — ABNORMAL HIGH (ref 11.5–15.5)
WBC: 18.7 10*3/uL — ABNORMAL HIGH (ref 4.0–10.5)
nRBC: 2.1 % — ABNORMAL HIGH (ref 0.0–0.2)

## 2018-07-22 LAB — MRSA PCR SCREENING: MRSA by PCR: NEGATIVE

## 2018-07-22 LAB — BLOOD GAS, ARTERIAL
Acid-base deficit: 14.4 mmol/L — ABNORMAL HIGH (ref 0.0–2.0)
Acid-base deficit: 8.1 mmol/L — ABNORMAL HIGH (ref 0.0–2.0)
Bicarbonate: 14.9 mmol/L — ABNORMAL LOW (ref 20.0–28.0)
Bicarbonate: 20 mmol/L (ref 20.0–28.0)
FIO2: 100
FIO2: 1
MECHVT: 550 mL
MECHVT: 550 mL
Mechanical Rate: 20
O2 Saturation: 91.9 %
O2 Saturation: 95.2 %
PEEP: 5 cmH2O
PEEP: 5 cmH2O
Patient temperature: 37
Patient temperature: 37
RATE: 20 resp/min
pCO2 arterial: 47 mmHg (ref 32.0–48.0)
pCO2 arterial: 50 mmHg — ABNORMAL HIGH (ref 32.0–48.0)
pH, Arterial: 7.11 — CL (ref 7.350–7.450)
pH, Arterial: 7.21 — ABNORMAL LOW (ref 7.350–7.450)
pO2, Arterial: 85 mmHg (ref 83.0–108.0)
pO2, Arterial: 92 mmHg (ref 83.0–108.0)

## 2018-07-22 LAB — URINE DRUG SCREEN, QUALITATIVE (ARMC ONLY)
Amphetamines, Ur Screen: NOT DETECTED
Barbiturates, Ur Screen: NOT DETECTED
Benzodiazepine, Ur Scrn: NOT DETECTED
Cannabinoid 50 Ng, Ur ~~LOC~~: NOT DETECTED
Cocaine Metabolite,Ur ~~LOC~~: NOT DETECTED
MDMA (Ecstasy)Ur Screen: NOT DETECTED
Methadone Scn, Ur: NOT DETECTED
Opiate, Ur Screen: NOT DETECTED
Phencyclidine (PCP) Ur S: NOT DETECTED
Tricyclic, Ur Screen: NOT DETECTED

## 2018-07-22 LAB — PROCALCITONIN
Procalcitonin: 0.88 ng/mL
Procalcitonin: 1.41 ng/mL

## 2018-07-22 LAB — CK: Total CK: 214 U/L (ref 49–397)

## 2018-07-22 LAB — LACTIC ACID, PLASMA
Lactic Acid, Venous: 2.8 mmol/L (ref 0.5–1.9)
Lactic Acid, Venous: 8.2 mmol/L (ref 0.5–1.9)

## 2018-07-22 LAB — GLUCOSE, CAPILLARY
Glucose-Capillary: 114 mg/dL — ABNORMAL HIGH (ref 70–99)
Glucose-Capillary: 111 mg/dL — ABNORMAL HIGH (ref 70–99)

## 2018-07-22 LAB — ABO/RH: ABO/RH(D): O NEG

## 2018-07-22 LAB — PHOSPHORUS: Phosphorus: 11.6 mg/dL — ABNORMAL HIGH (ref 2.5–4.6)

## 2018-07-22 LAB — FIBRINOGEN: Fibrinogen: 60 mg/dL — CL (ref 210–475)

## 2018-07-22 MED ORDER — DOPAMINE-DEXTROSE 3.2-5 MG/ML-% IV SOLN
0.0000 ug/kg/min | INTRAVENOUS | Status: DC
  Administered 2018-07-22: 5 ug/kg/min via INTRAVENOUS
  Filled 2018-07-22 (×3): qty 250

## 2018-07-22 MED ORDER — ASPIRIN EC 81 MG PO TBEC: 81.0000 mg | DELAYED_RELEASE_TABLET | Freq: Every day | ORAL | Status: DC

## 2018-07-22 MED ORDER — SODIUM CHLORIDE 0.9 % IV SOLN: INTRAVENOUS | Status: DC

## 2018-07-22 MED ORDER — HEPARIN SODIUM (PORCINE) 5000 UNIT/ML IJ SOLN: 5000.0000 [IU] | Freq: Three times a day (TID) | INTRAMUSCULAR | Status: DC

## 2018-07-22 MED ORDER — SODIUM CHLORIDE 0.9 % IV SOLN
0.0000 ug/min | INTRAVENOUS | Status: DC
  Filled 2018-07-22: qty 1

## 2018-07-22 MED ORDER — INSULIN ASPART 100 UNIT/ML ~~LOC~~ SOLN
SUBCUTANEOUS | Status: AC
  Administered 2018-07-22: 02:00:00 10 [IU] via INTRAVENOUS
  Filled 2018-07-22: qty 1

## 2018-07-22 MED ORDER — MAGNESIUM SULFATE 50 % IJ SOLN
2.0000 g | Freq: Once | INTRAMUSCULAR | Status: AC
  Administered 2018-07-22: 02:00:00 2 g via INTRAVENOUS

## 2018-07-22 MED ORDER — SODIUM CHLORIDE 0.9% IV SOLUTION
Freq: Once | INTRAVENOUS | Status: AC
  Administered 2018-07-22: 11:00:00 via INTRAVENOUS

## 2018-07-22 MED ORDER — ACETAMINOPHEN 650 MG RE SUPP: 650.0000 mg | Freq: Four times a day (QID) | RECTAL | Status: DC | PRN

## 2018-07-22 MED ORDER — FENTANYL 2500MCG IN NS 250ML (10MCG/ML) PREMIX INFUSION
INTRAVENOUS | Status: AC
  Filled 2018-07-22: qty 250

## 2018-07-22 MED ORDER — STERILE WATER FOR INJECTION IV SOLN
INTRAVENOUS | Status: DC
  Administered 2018-07-22: 02:00:00 via INTRAVENOUS
  Filled 2018-07-22 (×7): qty 850

## 2018-07-22 MED ORDER — ROCURONIUM BROMIDE 50 MG/5ML IV SOLN
100.0000 mg | Freq: Once | INTRAVENOUS | Status: AC
  Administered 2018-07-22: 100 mg via INTRAVENOUS
  Filled 2018-07-22: qty 10

## 2018-07-22 MED ORDER — CHLORHEXIDINE GLUCONATE 0.12% ORAL RINSE (MEDLINE KIT)
15.0000 mL | Freq: Two times a day (BID) | OROMUCOSAL | Status: DC
  Administered 2018-07-22: 15 mL via OROMUCOSAL

## 2018-07-22 MED ORDER — SODIUM BICARBONATE 8.4 % IV SOLN
50.0000 meq | Freq: Once | INTRAVENOUS | Status: AC
  Administered 2018-07-22: 02:00:00 50 meq via INTRAVENOUS

## 2018-07-22 MED ORDER — INSULIN ASPART 100 UNIT/ML ~~LOC~~ SOLN
10.0000 [IU] | Freq: Once | SUBCUTANEOUS | Status: AC
  Administered 2018-07-22: 02:00:00 10 [IU] via INTRAVENOUS

## 2018-07-22 MED ORDER — EPINEPHRINE 0.3 MG/0.3ML IJ SOAJ: 0.3000 mg | Freq: Once | INTRAMUSCULAR | Status: DC

## 2018-07-22 MED ORDER — ACETAMINOPHEN 325 MG PO TABS: 650.0000 mg | ORAL_TABLET | Freq: Four times a day (QID) | ORAL | Status: DC | PRN

## 2018-07-22 MED ORDER — MAGNESIUM SULFATE 2 GM/50ML IV SOLN: 2.0000 g | Freq: Once | INTRAVENOUS | Status: DC

## 2018-07-22 MED ORDER — ORAL CARE MOUTH RINSE
15.0000 mL | OROMUCOSAL | Status: DC
  Administered 2018-07-22: 12:00:00 15 mL via OROMUCOSAL

## 2018-07-22 MED ORDER — SODIUM BICARBONATE 8.4 % IV SOLN
50.0000 meq | Freq: Once | INTRAVENOUS | Status: AC
  Administered 2018-07-22: 02:00:00 50 meq via INTRAVENOUS
  Filled 2018-07-22: qty 50

## 2018-07-22 MED ORDER — SODIUM CHLORIDE 0.9 % IV SOLN
8.0000 mg/h | INTRAVENOUS | Status: DC
  Administered 2018-07-22: 8 mg/h via INTRAVENOUS
  Filled 2018-07-22: qty 80

## 2018-07-22 MED ORDER — PROPOFOL 1000 MG/100ML IV EMUL: 5.0000 ug/kg/min | INTRAVENOUS | Status: DC

## 2018-07-22 MED ORDER — ETOMIDATE 2 MG/ML IV SOLN
20.0000 mg | Freq: Once | INTRAVENOUS | Status: AC
  Administered 2018-07-22: 01:00:00 20 mg via INTRAVENOUS

## 2018-07-22 MED ORDER — NOREPINEPHRINE 16 MG/250ML-% IV SOLN
0.0000 ug/min | INTRAVENOUS | Status: DC
  Administered 2018-07-22: 30 ug/min via INTRAVENOUS
  Filled 2018-07-22: qty 250

## 2018-07-22 MED ORDER — METOPROLOL TARTRATE 50 MG PO TABS: 50.0000 mg | ORAL_TABLET | Freq: Two times a day (BID) | ORAL | Status: DC

## 2018-07-22 MED ORDER — HYDROCORTISONE NA SUCCINATE PF 100 MG IJ SOLR
100.0000 mg | Freq: Three times a day (TID) | INTRAMUSCULAR | Status: DC
  Administered 2018-07-22: 100 mg via INTRAVENOUS
  Filled 2018-07-22: qty 2

## 2018-07-22 MED ORDER — SODIUM CHLORIDE 0.9 % IV SOLN
INTRAVENOUS | Status: DC
  Administered 2018-07-22: 07:00:00 via INTRAVENOUS

## 2018-07-22 MED ORDER — DILTIAZEM HCL ER COATED BEADS 240 MG PO CP24
240.0000 mg | ORAL_CAPSULE | Freq: Every day | ORAL | Status: DC
  Filled 2018-07-22: qty 1

## 2018-07-22 MED ORDER — ONDANSETRON HCL 4 MG/2ML IJ SOLN: 4.0000 mg | Freq: Four times a day (QID) | INTRAMUSCULAR | Status: DC | PRN

## 2018-07-22 MED ORDER — DEXTROSE 50 % IV SOLN
1.0000 | Freq: Once | INTRAVENOUS | Status: AC
  Administered 2018-07-22: 50 mL via INTRAVENOUS

## 2018-07-22 MED ORDER — NOREPINEPHRINE 4 MG/250ML-% IV SOLN
0.0000 ug/min | INTRAVENOUS | Status: DC
  Administered 2018-07-22 (×2): 40 ug/min via INTRAVENOUS
  Administered 2018-07-22: 02:00:00 20 ug/min via INTRAVENOUS
  Filled 2018-07-22 (×5): qty 250

## 2018-07-22 MED ORDER — SODIUM BICARBONATE 8.4 % IV SOLN
150.0000 meq | Freq: Once | INTRAVENOUS | Status: AC
  Administered 2018-07-22: 150 meq via INTRAVENOUS

## 2018-07-22 MED ORDER — LEVALBUTEROL HCL 0.63 MG/3ML IN NEBU: 0.6300 mg | INHALATION_SOLUTION | Freq: Four times a day (QID) | RESPIRATORY_TRACT | Status: DC | PRN

## 2018-07-22 MED ORDER — SODIUM CHLORIDE 0.9 % IV BOLUS
1000.0000 mL | Freq: Once | INTRAVENOUS | Status: AC
  Administered 2018-07-22: 1000 mL via INTRAVENOUS

## 2018-07-22 MED ORDER — MAGNESIUM SULFATE 50 % IJ SOLN
2.0000 g | Freq: Once | INTRAMUSCULAR | Status: AC
  Administered 2018-07-22: 2 g via INTRAVENOUS
  Filled 2018-07-22: qty 4

## 2018-07-22 MED ORDER — FENTANYL 2500MCG IN NS 250ML (10MCG/ML) PREMIX INFUSION
0.0000 ug/h | INTRAVENOUS | Status: DC
  Administered 2018-07-22: 50 ug/h via INTRAVENOUS

## 2018-07-22 MED ORDER — VASOPRESSIN 20 UNIT/ML IV SOLN
0.0300 [IU]/min | INTRAVENOUS | Status: DC
  Administered 2018-07-22: 10:00:00 0.03 [IU]/min via INTRAVENOUS
  Filled 2018-07-22: qty 2

## 2018-07-22 MED ORDER — EPINEPHRINE 1 MG/10ML IJ SOSY
1.0000 | PREFILLED_SYRINGE | Freq: Once | INTRAMUSCULAR | Status: AC
  Administered 2018-07-22: 1 mg via INTRAVENOUS

## 2018-07-22 MED ORDER — LAMOTRIGINE 25 MG PO TABS: 50.0000 mg | ORAL_TABLET | Freq: Every day | ORAL | Status: DC

## 2018-07-22 MED ORDER — DILTIAZEM HCL 100 MG IV SOLR: 5.0000 mg/h | INTRAVENOUS | Status: DC

## 2018-07-22 MED ORDER — AMIODARONE IV BOLUS ONLY 150 MG/100ML
150.0000 mg | Freq: Once | INTRAVENOUS | Status: AC
  Administered 2018-07-22: 02:00:00 150 mg via INTRAVENOUS

## 2018-07-22 MED ORDER — FENTANYL BOLUS VIA INFUSION
50.0000 ug | INTRAVENOUS | Status: DC | PRN
  Administered 2018-07-22: 50 ug via INTRAVENOUS
  Filled 2018-07-22: qty 50

## 2018-07-22 MED ORDER — CALCIUM CHLORIDE 10 % IV SOLN
1.0000 g | Freq: Once | INTRAVENOUS | Status: AC
  Administered 2018-07-22: 02:00:00 1 g via INTRAVENOUS

## 2018-07-22 MED ORDER — ONDANSETRON HCL 4 MG PO TABS: 4.0000 mg | ORAL_TABLET | Freq: Four times a day (QID) | ORAL | Status: DC | PRN

## 2018-07-22 MED FILL — Medication: Qty: 1 | Status: AC

## 2018-07-23 LAB — BPAM CRYOPRECIPITATE
Blood Product Expiration Date: 202005311555
Blood Product Expiration Date: 202005311555
ISSUE DATE / TIME: 202005311036
ISSUE DATE / TIME: 202005311036
Unit Type and Rh: 5100
Unit Type and Rh: 5100

## 2018-07-23 LAB — URINE CULTURE: Culture: NO GROWTH

## 2018-07-23 LAB — PREPARE CRYOPRECIPITATE
Unit division: 0
Unit division: 0

## 2018-07-23 LAB — PATHOLOGIST SMEAR REVIEW

## 2018-07-23 NOTE — ED Notes (Signed)
Patients heart rate slowed to 54. Butch RN at bedside with RT while this RN informed MD Dolores Frame. Code button hit as MD and this RN were returning to room at 144

## 2018-07-23 NOTE — ED Provider Notes (Signed)
-----------------------------------------   2:07 AM on 07/19/2018 -----------------------------------------  I was called emergently to patient's bedside for patient unresponsive with apneic respirations.  Patient was intubated for airway protection.  Subsequently patient coded and CPR started.  Admitting physician Dr. Anne Hahn was paged and at bedside.  Patient had ROSC after that epinephrine, bicarb, insulin and glucose for hyperkalemia.  Magnesium given for torsades to point.  Patient was defibrillated x1.  Started on bicarb drip.  Please see nursing flowsheet for details. Son was notified and is returning to the ED family room.  INTUBATION Performed by: Irean Hong  Required items: required blood products, implants, devices, and special equipment available Patient identity confirmed: provided demographic data and hospital-assigned identification number Time out: Immediately prior to procedure a "time out" was called to verify the correct patient, procedure, equipment, support staff and site/side marked as required.  Indications: Respiratory failure  Intubation method: Glidescope Laryngoscopy   Preoxygenation: BVM  Sedatives: Etomidate Paralytic: Rocuronium  Tube Size: 7.5 cuffed  Post-procedure assessment: chest rise and ETCO2 monitor Breath sounds: equal and absent over the epigastrium Tube secured with: ETT holder Chest x-ray interpreted by radiologist and me.  Chest x-ray findings: endotracheal tube in appropriate position   CRITICAL CARE Performed by: Irean Hong   Total critical care time: 30 minutes  Critical care time was exclusive of separately billable procedures and treating other patients.  Critical care was necessary to treat or prevent imminent or life-threatening deterioration.  Critical care was time spent personally by me on the following activities: development of treatment plan with patient and/or surrogate as well as nursing, discussions with consultants,  evaluation of patient's response to treatment, examination of patient, obtaining history from patient or surrogate, ordering and performing treatments and interventions, ordering and review of laboratory studies, ordering and review of radiographic studies, pulse oximetry and re-evaluation of patient's condition.     Irean Hong, MD 07/01/2018 [phone]

## 2018-07-23 NOTE — Consult Note (Signed)
Reason for Consult: L pontine stroke  Referring Physician: Dr. Eugenio Hoes   CC: not following commands   HPI: Timothy Roach is an 63 y.o. male  presents with SOB. Patient presents the ED with few days progressive symptoms of malaise and cough.  Today he became more short of breath and developed some hemoptysis.  Pt does have A-fib and did not take any of his medications. Stopped following commands and needed emergent intubation. On multiple pressors.   Past Medical History:  Diagnosis Date  . A-fib (HCC)   . Anxiety   . Depression   . Hypertension   . Mania [facility])     Past Surgical History:  Procedure Laterality Date  . HERNIA REPAIR    . TONSILLECTOMY      Family History  Problem Relation Age of Onset  . Diabetes Mother   . Vasculitis Father     Social History:  reports that he has never smoked. He has never used smokeless tobacco. He reports that he does not drink alcohol or use drugs.  No Known Allergies  Medications: I have reviewed the patient's current medications.  ROS: Unable to obtain as intubated and not following commadns   Physical Examination: Blood pressure 92/78, pulse (!) 120, temperature (!) 92.8 F (33.8 C), temperature source Bladder, resp. rate (!) 27, height  (1.854 m), weight 113.4 kg, SpO2 100 %.  Not following commands No withdrawal from pain  Is overbreathing the ventilator.  Cough and Gag present  Laboratory Studies:   Basic Metabolic Panel: Recent Labs  Lab 06/30/2018 2020 08-07-2018 0603  NA 131* 141  K 6.3* 5.7*  CL 95* 97*  CO2 16* 22  GLUCOSE 106* 113*  BUN 178* 177*  CREATININE 5.13* 5.09*  CALCIUM 8.6* 7.8*  MG  --  4.6*  PHOS  --  11.6*    Liver Function Tests: Recent Labs  Lab 06/30/2018 2020 Aug 07, 2018 0603  AST 209* 313*  ALT 329* 257*  ALKPHOS 133* 92  BILITOT 7.2* 6.1*  PROT 6.7 4.5*  ALBUMIN 3.1* 1.9*   No results for input(s): LIPASE, AMYLASE in the last 168 hours. No results for input(s): AMMONIA in  the last 168 hours.  CBC: Recent Labs  Lab 07/04/2018 2020 08-07-18 0603  WBC 13.0* 18.7*  NEUTROABS 12.1*  --   HGB 18.3* 15.0  HCT 54.1* 46.3  MCV 95.9 100.0  PLT 76* 54*    Cardiac Enzymes: Recent Labs  Lab 07/11/2018 2020 08/07/2018 0603  CKTOTAL  --  214  TROPONINI 0.04*  --     BNP: Invalid input(s): POCBNP  CBG: Recent Labs  Lab 08-07-18 0117 2018/08/07 0508  GLUCAP 114* 111*    Microbiology: Results for orders placed or performed during the hospital encounter of 07/11/2018  SARS Coronavirus 2 (CEPHEID - Performed in [facility] hospital lab), Hosp Order     Status: None   Collection Time: 07/20/2018  8:20 PM  Result Value Ref Range Status   SARS Coronavirus 2 NEGATIVE NEGATIVE Final    Comment: (NOTE) If result is NEGATIVE SARS-CoV-2 target nucleic acids are NOT DETECTED. The SARS-CoV-2 RNA is generally detectable in upper and lower  respiratory specimens during the acute phase of infection. The lowest  concentration of SARS-CoV-2 viral copies this assay can detect is 250  copies / mL. A negative result does not preclude SARS-CoV-2 infection  and should not be used as the sole basis for treatment or other  patient management decisions.  A negative  result may occur with  improper specimen collection / handling, submission of specimen other  than nasopharyngeal swab, presence of viral mutation(s) within the  areas targeted by this assay, and inadequate number of viral copies  (<250 copies / mL). A negative result must be combined with clinical  observations, patient history, and epidemiological information. If result is POSITIVE SARS-CoV-2 target nucleic acids are DETECTED. The SARS-CoV-2 RNA is generally detectable in upper and lower  respiratory specimens dur ing the acute phase of infection.  Positive  results are indicative of active infection with SARS-CoV-2.  Clinical  correlation with patient history and other diagnostic information is  necessary to  determine patient infection status.  Positive results do  not rule out bacterial infection or co-infection with other viruses. If result is PRESUMPTIVE POSTIVE SARS-CoV-2 nucleic acids MAY BE PRESENT.   A presumptive positive result was obtained on the submitted specimen  and confirmed on repeat testing.  While 2019 novel coronavirus  (SARS-CoV-2) nucleic acids may be present in the submitted sample  additional confirmatory testing may be necessary for epidemiological  and / or clinical management purposes  to differentiate between  SARS-CoV-2 and other Sarbecovirus currently known to infect humans.  If clinically indicated additional testing with an alternate test  methodology [phone]) is advised. The SARS-CoV-2 RNA is generally  detectable in upper and lower respiratory sp ecimens during the acute  phase of infection. The expected result is Negative. Fact Sheet for Patients:  BoilerBrush.com.cy Fact Sheet for Healthcare Providers: https://pope.com/ This test is not yet approved or cleared by the [place] FDA and has been authorized for detection and/or diagnosis of SARS-CoV-2 by FDA under an Emergency Use Authorization (EUA).  This EUA will remain in effect (meaning this test can be used) for the duration of the COVID-19 declaration under Section 564(b)(1) of the Act, 21 U.S.C. section 360bbb-3(b)(1), unless the authorization is terminated or revoked sooner. Performed at [facility], [address] [place], [place] [address]   Culture, blood (Routine X 2) w Reflex to ID Panel     Status: None (Preliminary result)   Collection Time: 08/16/18 11:35 PM  Result Value Ref Range Status   Specimen Description BLOOD RIGHT ARM  Final   Special Requests   Final    BOTTLES DRAWN AEROBIC AND ANAEROBIC Blood Culture adequate volume   Culture   Final    NO GROWTH < 12 HOURS Performed at [facility], [address] [place], [place] [address]    Report Status PENDING  Incomplete  Culture, blood (Routine X 2) w Reflex to ID Panel     Status: None (Preliminary result)   Collection Time: 08-16-18 11:40 PM  Result Value Ref Range Status   Specimen Description BLOOD LEFT ANTECUBITAL  Final   Special Requests   Final    BOTTLES DRAWN AEROBIC AND ANAEROBIC Blood Culture results may not be optimal due to an excessive volume of blood received in culture bottles   Culture   Final    NO GROWTH < 12 HOURS Performed at [facility], [address], [place], [place] [address]    Report Status PENDING  Incomplete    Coagulation Studies: Recent Labs    06/28/2018 0603  LABPROT 47.4*  INR 5.3*    Urinalysis: No results for input(s): COLORURINE, LABSPEC, PHURINE, GLUCOSEU, HGBUR, BILIRUBINUR, KETONESUR, PROTEINUR, UROBILINOGEN, NITRITE, LEUKOCYTESUR in the last 168 hours.  Invalid input(s): APPERANCEUR  Lipid Panel:     Component Value  Date/Time   CHOL 134 03/10/2017 1121   TRIG 160 (H) 03/10/2017 1121   HDL 23 (L) 03/10/2017 1121   LDLCALC 79 03/10/2017 1121    HgbA1C: No results found for: HGBA1C  Urine Drug Screen:  No results found for: LABOPIA, COCAINSCRNUR, LABBENZ, AMPHETMU, THCU, LABBARB  Alcohol Level: No results for input(s): ETH in the last 168 hours.  Other results:  Imaging: Dg Chest 1 View  Result Date: 07/19/2018 CLINICAL DATA:  Initial evaluation status post intubation. EXAM: CHEST  1 VIEW COMPARISON:  Prior radiograph from earlier the same day. FINDINGS: Endotracheal tube in place with tip positioned approximately 3.4 cm above the carina. Enteric tube courses into the abdomen, side hole just beyond the GE junction. Defibrillator pads overlies the left chest. Stable cardiomegaly. Mediastinal silhouette normal. Lungs mildly hypoinflated. Increased perihilar vascular congestion with diffuse interstitial prominence, concerning for developing pulmonary interstitial edema. Worsened  bibasilar opacities favored to reflect edema and/or atelectasis, although infiltrates could be considered. Small bilateral pleural effusions. No pneumothorax. No acute osseous finding. IMPRESSION: 1. Tip of the endotracheal tube approximately 3.4 cm above the carina. 2. Enteric tube courses into the abdomen, side hole just beyond the GE junction. 3. Cardiomegaly with interval worsening in perihilar vascular congestion with interstitial prominence, concerning for developing pulmonary interstitial edema. Probable small bilateral pleural effusions. 4. Worsened superimposed patchy bibasilar opacities, favored to reflect atelectasis and/or congestion. Superimposed infiltrates could be considered in the correct clinical setting. Electronically Signed   By: Rise Mu M.D.   On: 06/28/2018 04:49   Dg Chest Port 1 View  Result Date: 07/06/2018 CLINICAL DATA:  Central line placement EXAM: PORTABLE CHEST 1 VIEW COMPARISON:  Chest radiograph from earlier today. FINDINGS: Endotracheal tube tip is 2.1 cm above the carina. Enteric tube terminates in the gastric fundus. Right internal jugular central venous catheter terminates over the right atrium. Stable cardiomediastinal silhouette with mild cardiomegaly. No pneumothorax. No pleural effusion. Fluffy parahilar opacity throughout both lungs, slightly worsened. IMPRESSION: 1. No pneumothorax.  Support structures as detailed. 2. Cardiomegaly with slightly worsened diffuse fluffy parahilar lung opacities, favoring pulmonary edema. Electronically Signed   By: Delbert Phenix M.D.   On: 07/08/2018 08:58   Dg Chest Portable 1 View  Result Date: 08-06-2018 CLINICAL DATA:  Shortness of breath EXAM: PORTABLE CHEST 1 VIEW COMPARISON:  05/24/2015 FINDINGS: Cardiomegaly with suspected small pleural effusions. Patchy bibasilar airspace disease. No overt edema. No pneumothorax. IMPRESSION: Cardiomegaly with suspected small pleural effusions. Basilar airspace disease, atelectasis  versus pneumonia Electronically Signed   By: Jasmine Pang M.D.   On: 08-06-2018 20:40   Ct Head Code Stroke Wo Contrast`  Result Date: 07/03/2018 CLINICAL DATA:  Code stroke. Patient has become unresponsive in required intubation. EXAM: CT HEAD WITHOUT CONTRAST TECHNIQUE: Contiguous axial images were obtained from the base of the skull through the vertex without intravenous contrast. COMPARISON:  None. FINDINGS: Brain: Brain does not show age accelerated atrophy. Question low-density within the left pons. This could be artifactual or indicate an acute pontine infarction. There is no evidence of old or acute cerebral hemispheric focal infarction, mass lesion, hemorrhage, hydrocephalus or extra-axial collection. Vascular: No abnormal vascular finding. Skull: Negative Sinuses/Orbits: Clear/normal Other: None ASPECTS ([place]ta Stroke Program Early CT Score) - Ganglionic level infarction (caudate, lentiform nuclei, internal capsule, insula, M1-M3 cortex): 7 - Supraganglionic infarction (M4-M6 cortex): 3 Total score (0-10 with 10 being normal): 10 IMPRESSION: 1. No evidence of cortical infarction. Cannot rule out left pontine infarction. Consider MRI.  2. ASPECTS is 10. 3. Call report in progress. Electronically Signed   By: Paulina FusiMark  Shogry M.D.   On: 07/10/2018 09:23     Assessment/Plan:  63 y.o. male  presents with SOB. Patient presents the ED with few days progressive symptoms of malaise and cough.  Today he became more short of breath and developed some hemoptysis.  Pt does have A-fib and did not take any of his medications. Stopped following commands and needed emergent intubation. On multiple pressors.   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    Pauletta BrownsZEYLIKMAN, Nakiyah Beverley  07/01/2018, 11:49 AM

## 2018-07-23 NOTE — Progress Notes (Addendum)
   CRITICAL CARE PROGRESS NOTE   Spoke to son to update on patient condition.   Explained he is critically ill in septic shock with serology suggestive of DIC.   Additionally hemodynamically unstable requiring emergent central line access despite coagulopathy for infusion of cryoprecipitate and vasopressor support.    Son appreciates update agrees with care plan.   Patient with blown non-reactive pupils.  Emergent CTH performed.  Discussed with radiologist- suggestive of pontine infarction.  Radiology recommendation for follow up neuroimaging with MRI. Neurology consultation placed, input appreciated.    Patient remains critically ill on MV and multiple vasopressors.   Very poor prognosis overall.          Vida Rigger, M.D.  Pulmonary & Critical Care Medicine  [facility]       Critical care provider statement:    Critical care time (minutes):  109   Critical care time was exclusive of:  Separately billable procedures and  treating other patients   Critical care was necessary to treat or prevent imminent or  life-threatening deterioration of the following conditions:  acutely comatose, septic shock, DIC, acute CVA, multiple comorbid conditions   Critical care was time spent personally by me on the following  activities:  Development of treatment plan with patient or surrogate,  discussions with consultants, evaluation of patient's response to  treatment, examination of patient, obtaining history from patient or  surrogate, ordering and performing treatments and interventions, ordering  and review of laboratory studies and re-evaluation of patient's condition   I assumed direction of critical care for this patient from another  provider in my specialty: no

## 2018-07-23 NOTE — Progress Notes (Signed)
Pastoral Care Visit   26-Jul-2018 1000  Clinical Encounter Type  Visited With Patient;Family;Health care provider  Visit Type Critical Care;Death;Patient actively dying  Referral From Nurse  Consult/Referral To Chaplain  Spiritual Encounters  Spiritual Needs Prayer;Grief support   Pt was not responsive when chap was called to assist family for actively dying pt.  Pt's two sons and sister visited.  Pt had been separated from wife for over two years.  This chap advised sons to contact pt wife as a courtesy if nothing else.  They did, she did not choose to visit.  Chap assisted with grief care, as liasing with staff, and answering questions of sons who were struggling to make sense of their father's passing.   Milinda Antis, [address]

## 2018-07-23 NOTE — Death Summary Note (Signed)
Death summary   HPI: This is a 63 year old male who presented to the ED with complaints of shortness of breath and cough for a few days.  His ED work-up showed A. fib with RVR, bibasilar airspace disease on chest x-ray, hyperkalemia with a potassium level of 6.3, mildly elevated LFTs, BNP of 1873, lactic acid of 2.8, creatinine of 5.13 up from a baseline of 0.9, and a troponin of 0.04.  He was ruled in for sepsis and started on broad-spectrum antibiotics as well as diltiazem infusion for A. fib with RVR.  While awaiting transfer out of the ED to the floor, patient became unresponsive requiring emergent intubation.  ABG post intubation showed a pH of 7.1, PCO2 of 47, PO2 of 85, bicarb of 14.9 and an SPO2 of 91.9%.  He was transferred to the ICU for further management. Upon arrival in the ICU, patient had dilated fixed and unresponsive pupils with severely cyanotic extremities.  He was on no sedation and unresponsive to noxious stimulus.  His mean arterial pressure was in the 40s requiring additional pressors.  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  Family conference was held as per most recent progress note by me.  Patient was made comfort care and passed at 1605, may he rest in peace.      PAST MEDICAL HISTORY   Past Medical History:  Diagnosis Date  . A-fib (HCC)   . Anxiety   . Depression   . Hypertension   . [facility](HCC)      SURGICAL HISTORY   Past Surgical History:  Procedure Laterality Date  . HERNIA REPAIR    . TONSILLECTOMY       FAMILY HISTORY   Family History  Problem Relation Age of Onset  . Diabetes Mother   . Vasculitis Father      SOCIAL HISTORY    Social History   Tobacco Use  . Smoking status: Never Smoker  . Smokeless tobacco: Never Used  Substance Use Topics  . Alcohol use: No  . Drug use: No     MEDICATIONS   Current Medication:  Current Facility-Administered Medications:  .  acetaminophen (TYLENOL) tablet 650 mg, 650 mg, Oral, Q6H PRN **OR** acetaminophen (TYLENOL) suppository 650 mg, 650 mg, Rectal, Q6H PRN, Oralia ManisWillis, David, MD .  fentaNYL (SUBLIMAZE) bolus via infusion 50  mcg, 50 mcg, Intravenous, Q30 min PRN, Vida Rigger, MD, 50 mcg at 2018-08-10 1547 .  fentaNYL in NS (29mcg/ml) infusion-PREMIX, 0-400 mcg/hr, Intravenous, Continuous, Caci Orren, MD, Last Rate: 20 mL/hr at 08-10-18 1547, 200 mcg/hr at August 10, 2018 1547    ALLERGIES   Patient has no known allergies.    This document was prepared using Dragon voice recognition software and may include unintentional dictation errors.    Vida Rigger, M.D.  Division of Pulmonary & Critical Care Medicine  [facility]

## 2018-07-23 NOTE — ED Notes (Signed)
Patient's pupil's fixed and dilated.

## 2018-07-23 NOTE — ED Notes (Signed)
X-ray at bedside

## 2018-07-23 NOTE — ED Notes (Signed)
Compressions paused, pulse check. HR 134

## 2018-07-23 NOTE — Progress Notes (Signed)
RT assisted with patient transport to and from CT with no complications. 

## 2018-07-23 NOTE — ED Notes (Signed)
RN to bedside. Patient not responding to verbal or painful stimuli. MD Dolores Frame called to bedside.

## 2018-07-23 NOTE — Progress Notes (Signed)
Notified by ED nursing staff that patient had undergone intubation by ED physician and subsequently became unresponsive and pulseless.  Patient was given 1 round of epinephrine and chest compressions were started.  Patient's initial rhythm was likely PEA.  On second pulse check after first round of epi his rhythm was torsades.  Patient was given bicarb, insulin with D50, calcium, magnesium.  He received another dose of epinephrine.  He remained in torsades.  He received 1 defibrillating shock at 120 J and achieved an organized rhythm, and attained ROSC with a weak pulse.  At that point he was given more IV magnesium, and more IV bicarb.  And then a bicarb drip was started.  He subsequently had sustained V. tach, and received an amiodarone bolus.  His blood pressure then dropped and he required Levophed drip.  Patient's son was updated on patient's status.  He was made aware of the very tenuous nature of the patient's condition and his likely poor prognosis.  Son was going to speak with family to determine definitive CODE STATUS.  We are currently working with nursing staff in ICU to try to obtain an ICU bed for this patient.  Stat potassium blood draw was sent during the code, pending results at this time to determine if the patient will need more definitive treatment for his hyperkalemia.  Barney Drain [facility] 10-Aug-2018, 3:02 AM  Note:  This document was prepared using Dragon voice recognition software and may include unintentional dictation errors.

## 2018-07-23 NOTE — ED Notes (Signed)
Patient in vfib

## 2018-07-23 NOTE — Procedures (Signed)
Central Venous Catheter Placement:TRIPLE LUMEN Indication: Patient receiving vesicant or irritant drug.; Patient receiving intravenous therapy for longer than 5 days.; Patient has limited or no vascular access.   Consent:emergent    Hand washing performed prior to starting the procedure.   Procedure:   An active timeout was performed and correct patient, name, & ID confirmed.   Patient was positioned correctly for central venous access.  Patient was prepped using strict sterile technique including chlorohexadine preps, sterile drape, sterile gown and sterile gloves.    The area was prepped, draped and anesthetized in the usual sterile manner. Patient comfort was obtained.    A triple lumen catheter was placed in Right Internal Jugular Vein There was good blood return, catheter caps were placed on lumens, catheter flushed easily, the line was secured and a sterile dressing and BIO-PATCH applied.   Ultrasound was used to visualize vasculature and guidance of needle.   Number of Attempts: 1 Complications:none Estimated Blood Loss: none Chest Radiograph indicated and ordered.      Timothy Roach, M.D.  Pulmonary & Critical Care Medicine  [facility]     

## 2018-07-23 NOTE — ED Notes (Signed)
Pulse check. Faint pulse.

## 2018-07-23 NOTE — ED Notes (Signed)
Pulse check, no pulse, CPR resumed

## 2018-07-23 NOTE — ED Notes (Signed)
RT arrived for transport

## 2018-07-23 NOTE — ED Notes (Signed)
This RN and MD Sheryle Hail at bedside for CVC placement. Time out performed. Sterile technique performed.

## 2018-07-23 NOTE — Progress Notes (Signed)
Patient extubated to comfort care per MD order.

## 2018-07-23 NOTE — ED Notes (Addendum)
Patient in vtach, pulse present. MD Anne Hahn informed. MD ordered amnio bolus

## 2018-07-23 NOTE — Progress Notes (Signed)
Patient expired 1605 with this RN and chaplain at bedside. Family requested to wait in family room and to be notified of passing time. MD Aleskerov made aware. CDS notified.

## 2018-07-23 NOTE — ED Notes (Signed)
Patient in Torsades heart rhythm. Magnesium ordered

## 2018-07-23 NOTE — ED Notes (Signed)
Consulting civil engineer, additional RN's and NT's to bedside. RT paged.

## 2018-07-23 NOTE — ED Notes (Signed)
Admitting MD paged to inform him of critical lab values. Waiting on response.

## 2018-07-23 NOTE — ED Notes (Signed)
Pulse check CPR resumed

## 2018-07-23 NOTE — Consult Note (Signed)
PULMONARY / CRITICAL CARE MEDICINE  Name: Timothy Roach MRN: [id number] DOB: 01/22/1956    LOS: 1  Referring Provider: Dr. Anne Hahn Reason for Referral: Acute respiratory failure requiring emergent intubation and unresponsiveness Brief patient description: 63 year old male with a medical history as indicated below who presented to the ED with complaints of shortness of breath and cough.  While in the ED, patient was noted to have some hemoptysis.  He was diagnosed with pneumonia and sepsis and was awaiting transfer out of the ED when he became unresponsive requiring emergent intubation.  Now in septic shock with DIC.  Overall prognosis is poor  HPI: This is a 63 year old male who presented to the ED with complaints of shortness of breath and cough for a few days.  His ED work-up showed A. fib with RVR, bibasilar airspace disease on chest x-ray, hyperkalemia with a potassium level of 6.3, mildly elevated LFTs, BNP of 1873, lactic acid of 2.8, creatinine of 5.13 up from a baseline of 0.9, and a troponin of 0.04.  He was ruled in for sepsis and started on broad-spectrum antibiotics as well as diltiazem infusion for A. fib with RVR.  While awaiting transfer out of the ED to the floor, patient became unresponsive requiring emergent intubation.  ABG post intubation showed a pH of 7.1, PCO2 of 47, PO2 of 85, bicarb of 14.9 and an SPO2 of 91.9%.  He was transferred to the ICU for further management. Upon arrival in the ICU, patient had dilated fixed and unresponsive pupils with severely cyanotic extremities.  He was on no sedation and unresponsive to noxious stimulus.  His mean arterial pressure was in the 40s requiring additional pressors.  He is currently on maximum doses of dopamine, and norepinephrine.  Past Medical History:  Diagnosis Date  . A-fib (HCC)   . Anxiety   . Depression   . Hypertension   . [facility])    Past Surgical History:  Procedure Laterality Date  . HERNIA REPAIR    .  TONSILLECTOMY     No current facility-administered medications on file prior to encounter.    Current Outpatient Medications on File Prior to Encounter  Medication Sig  . aspirin EC 81 MG tablet Take 81 mg by mouth daily.  Marland Kitchen diltiazem (DILACOR XR) 240 MG 24 hr capsule Take 240 mg by mouth daily.  Marland Kitchen lamoTRIgine (LAMICTAL) 25 MG tablet Take 2 tablets (50 mg total) by mouth daily.  . metoprolol (LOPRESSOR) 50 MG tablet Take 1 tablet (50 mg total) by mouth 2 (two) times daily.  . Multiple Vitamins-Minerals (MULTIVITAMIN WITH MINERALS) tablet Take 1 tablet by mouth daily.  Marland Kitchen OLANZapine (ZYPREXA) 5 MG tablet Take 1 tablet (5 mg total) by mouth at bedtime.    Allergies No Known Allergies  Family History Family History  Problem Relation Age of Onset  . Diabetes Mother   . Vasculitis Father    Social History  reports that he has never smoked. He has never used smokeless tobacco. He reports that he does not drink alcohol or use drugs.  Review Of Systems: Unable to obtain as patient is currently intubated VITAL SIGNS: BP (!) 67/52   Pulse (!) 109   Temp (!) 95 F (35 C) (Bladder)   Resp 20   Ht  (1.854 m)   Wt 113.4 kg   SpO2 99%   BMI 32.98 kg/m   HEMODYNAMICS:    VENTILATOR SETTINGS: Vent Mode: PRVC FiO2 (%):  [100 %] 100 % Set Rate:  [  20 bmp] 20 bmp Vt Set:  [550 mL] 550 mL PEEP:  [5 cmH20] 5 cmH20  INTAKE / OUTPUT: No intake/output data recorded.  PHYSICAL EXAMINATION: General: Appears dead HEENT:  normocephalic and atraumatic, pupils are dilated, fixed and nonresponsive, no corneal reflexes and no gag reflex Neuro: Unresponsive to the noxious stimulus, reflexes absent Cardiovascular: Apical pulse irregular, tachycardic in the 130s, S1-S2, no murmur regurg or gallop, +1 Doppler pulses in bilateral upper extremities, nonpalpable pulses in bilateral lower extremities, +3 pitting edema in bilateral lower extremities Lungs: Bilateral breath sounds with diffuse  rhonchi and rales, diminished in the bases Abdomen: Nondistended, normal bowel sounds in all 4 quadrants, palpation reveals no organomegaly Musculoskeletal: Positive range of motion, no joint deformities Skin: Skin is jaundiced, pale, cool to touch  LABS:  BMET Recent Labs  Lab 09-23-2018 2020  NA 131*  K 6.3*  CL 95*  CO2 16*  BUN 178*  CREATININE 5.13*  GLUCOSE 106*    Electrolytes Recent Labs  Lab 09-23-2018 2020  CALCIUM 8.6*    CBC Recent Labs  Lab 09-23-2018 2020  WBC 13.0*  HGB 18.3*  HCT 54.1*  PLT 76*    Coag's No results for input(s): APTT, INR in the last 168 hours.  Sepsis Markers Recent Labs  Lab 09-23-2018 2341 09-23-2018 2342  LATICACIDVEN 2.8*  --   PROCALCITON  --  1.41    ABG Recent Labs  Lab 07/07/2018 0230 07/07/2018 0552  PHART 7.11* 7.21*  PCO2ART 47 50*  PO2ART 85 92    Liver Enzymes Recent Labs  Lab 09-23-2018 2020  AST 209*  ALT 329*  ALKPHOS 133*  BILITOT 7.2*  ALBUMIN 3.1*    Cardiac Enzymes Recent Labs  Lab 09-23-2018 2020  TROPONINI 0.04*    Glucose Recent Labs  Lab 07/01/2018 0117 06/24/2018 0508  GLUCAP 114* 111*    Imaging Dg Chest 1 View  Result Date: 07/16/2018 CLINICAL DATA:  Initial evaluation status post intubation. EXAM: CHEST  1 VIEW COMPARISON:  Prior radiograph from earlier the same day. FINDINGS: Endotracheal tube in place with tip positioned approximately 3.4 cm above the carina. Enteric tube courses into the abdomen, side hole just beyond the GE junction. Defibrillator pads overlies the left chest. Stable cardiomegaly. Mediastinal silhouette normal. Lungs mildly hypoinflated. Increased perihilar vascular congestion with diffuse interstitial prominence, concerning for developing pulmonary interstitial edema. Worsened bibasilar opacities favored to reflect edema and/or atelectasis, although infiltrates could be considered. Small bilateral pleural effusions. No pneumothorax. No acute osseous finding.  IMPRESSION: 1. Tip of the endotracheal tube approximately 3.4 cm above the carina. 2. Enteric tube courses into the abdomen, side hole just beyond the GE junction. 3. Cardiomegaly with interval worsening in perihilar vascular congestion with interstitial prominence, concerning for developing pulmonary interstitial edema. Probable small bilateral pleural effusions. 4. Worsened superimposed patchy bibasilar opacities, favored to reflect atelectasis and/or congestion. Superimposed infiltrates could be considered in the correct clinical setting. Electronically Signed   By: Rise MuBenjamin  McClintock M.D.   On: 07/04/2018 04:49   Dg Chest Portable 1 View  Result Date: 02/07/2019 CLINICAL DATA:  Shortness of breath EXAM: PORTABLE CHEST 1 VIEW COMPARISON:  05/24/2015 FINDINGS: Cardiomegaly with suspected small pleural effusions. Patchy bibasilar airspace disease. No overt edema. No pneumothorax. IMPRESSION: Cardiomegaly with suspected small pleural effusions. Basilar airspace disease, atelectasis versus pneumonia Electronically Signed   By: Jasmine PangKim  Fujinaga M.D.   On: 08-02-202020 20:40    STUDIES:  Stat CT head 2D echo  CULTURES: Blood cultures  x2 Urine culture COVID screen negative MRSA swab pending  ANTIBIOTICS: Azithromycin Ceftriaxone  SIGNIFICANT EVENTS: 5 /31/2020:: Admitted  LINES/TUBES: Peripheral IVs Foley catheter  DISCUSSION: 63 year old male presenting with sepsis secondary to pneumonia, severe septic shock with DIC, severe lactic acidosis, acute renal failure, acute change in mental status and acute hypoxic respiratory failure  ASSESSMENT / PLAN:  PULMONARY A: Acute hypoxic respiratory failure Community-acquired pneumonia Severe lactic acidosis ?  Hemoptysis P:   Full vent support with current settings Antibiotics as above Nebulized bronchodilators IV steroids ABG and chest x-ray as needed Weaning trials as tolerated Monitor for worsening hemoptysis and rule out GI source    CARDIOVASCULAR A:  Septic shock A. fib with RVR Congestive heart failure as evidenced by elevated proBNP and lower extremity edema P:  Hemodynamic monitoring per ICU protocol Hold diltiazem in light of shock IV fluid boluses and pressors to maintain mean arterial blood pressure greater than 65 DIC panel Discontinue all antihypertensives in light of shock  RENAL A:   Acute renal failure-baseline creatinine 0.9, currently 5.1 Hyperkalemia P:   Nephrology consulted Already given calcium, dextrose and insulin in the ED Will obtain repeat labs  GASTROINTESTINAL A:   Elevated LFTs likely due to shock Hemoptysis versus acute GI bleed P:   Trend LFTs Protonix infusion  HEMATOLOGIC A:   Severe thrombocytopenia-platelets 76 down from baseline of 199 P:  Obtain DIC panel Trend platelet level and transfuse as needed  INFECTIOUS A:   Sepsis secondary to pneumonia Community-acquired pneumonia P:   Antibiotics as above Follow-up cultures Trend procalcitonin  ENDOCRINE A:   Mild hyperglycemia P:   Blood glucose monitoring every 4 hours Sliding scale coverage for now  NEUROLOGIC A:   Acute loss of consciousness-rule out CVA, patient is at increased risk due to atrial fibrillation P:   RASS goal: 0 STAT CT head to rule out CVA Neurochecks every 2 hours Neurology consult  Best Practice: Code Status: Full code Diet: N.p.o. GI prophylaxis: PPI VTE prophylaxis: Hold off on pharmacologic VTE prophylaxis in light of severe thrombocytopenia, SCDs  FAMILY  - Updates: Patient's family updated by ED attending and admitting attending.  Will update with further changes  Patient's overall prognosis is poor.  Armella Stogner S. Tukov-Yual ANP-BC Pulmonary and Critical Care Medicine [facility] Pager [phone] or [phone]  NB: This document was prepared using Dragon voice recognition software and may include unintentional dictation errors.    2018/08/01,  6:19 AM

## 2018-07-23 NOTE — ED Notes (Signed)
CPR begun by [employer]

## 2018-07-23 DEATH — deceased

## 2018-07-24 LAB — HIV ANTIBODY (ROUTINE TESTING W REFLEX): HIV Screen 4th Generation wRfx: NONREACTIVE

## 2018-07-26 ENCOUNTER — Ambulatory Visit: Payer: PRIVATE HEALTH INSURANCE | Admitting: Family Medicine

## 2018-07-26 LAB — CULTURE, BLOOD (ROUTINE X 2)
Culture: NO GROWTH
Culture: NO GROWTH
Special Requests: ADEQUATE

## 2019-12-02 IMAGING — DX PORTABLE CHEST - 1 VIEW
1 series · 1 of 1 positions shown · non-contrast
Comparison: 05/24/2015

CLINICAL DATA: Shortness of breath

EXAM:
PORTABLE CHEST 1 VIEW

[chest ap]
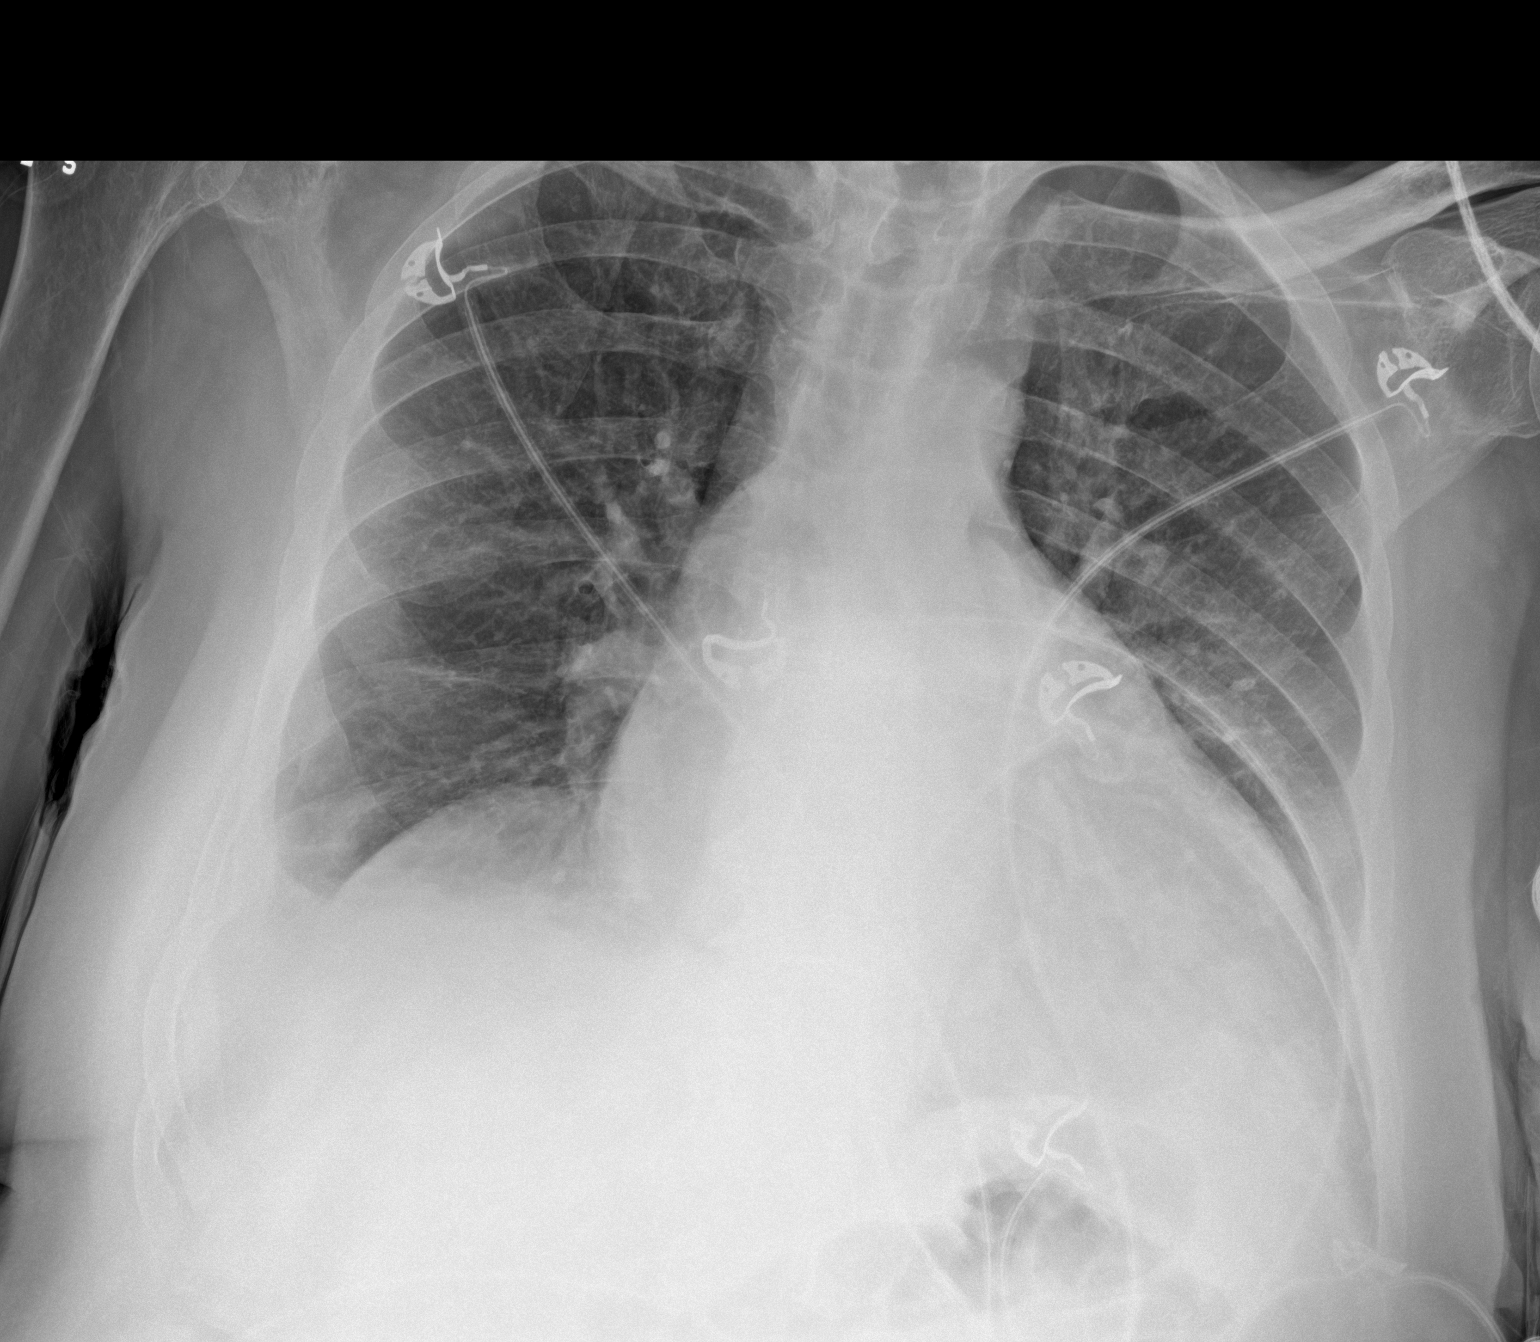

[1 of 1 positions shown; findings below may reference images not displayed]

FINDINGS: Cardiomegaly with suspected small pleural effusions. Patchy
bibasilar airspace disease. No overt edema. No pneumothorax.
IMPRESSION: Cardiomegaly with suspected small pleural effusions. Basilar
airspace disease, atelectasis versus pneumonia

## 2020-05-29 ENCOUNTER — Telehealth: Payer: Self-pay

## 2020-05-29 NOTE — Telephone Encounter (Signed)
Copied from CRM [id number]. Topic: General - Other >> May 29, 2020 12:22 PM Marylen Ponto wrote: Reason for CRM: Pt son Molly Maduro called to find out what he needs to do to obtain a copy of pt medical records. Cb# [phone]

## 2020-06-12 NOTE — Telephone Encounter (Signed)
Son called and stated he has not heard from anyone or been contacted about his last call/ please follow up / he is requesting last 4 years of diagnoses and medications
